# Patient Record
Sex: Male | Born: 1960 | Race: White | Hispanic: No | Marital: Married | Smoking: Never smoker
Health system: Southern US, Community
[De-identification: ages and names within clinical notes are randomized; demographics above are authoritative.]

## PROBLEM LIST (undated history)

## (undated) DIAGNOSIS — N289 Disorder of kidney and ureter, unspecified: Secondary | ICD-10-CM

## (undated) DIAGNOSIS — I2510 Atherosclerotic heart disease of native coronary artery without angina pectoris: Secondary | ICD-10-CM

## (undated) DIAGNOSIS — I519 Heart disease, unspecified: Secondary | ICD-10-CM

## (undated) HISTORY — PX: COLONOSCOPY: SHX174

## (undated) HISTORY — PX: SKIN CANCER EXCISION: SHX779

---

## 1991-12-28 HISTORY — PX: VASECTOMY: SHX75

## 2003-09-06 ENCOUNTER — Encounter: Payer: Self-pay | Admitting: Family Medicine

## 2003-09-06 ENCOUNTER — Encounter: Admission: RE | Admit: 2003-09-06 | Discharge: 2003-09-06 | Payer: Self-pay | Admitting: Family Medicine

## 2003-09-12 ENCOUNTER — Encounter: Payer: Self-pay | Admitting: Family Medicine

## 2003-09-12 ENCOUNTER — Encounter: Admission: RE | Admit: 2003-09-12 | Discharge: 2003-09-12 | Payer: Self-pay | Admitting: Family Medicine

## 2004-12-27 HISTORY — PX: KNEE SURGERY: SHX244

## 2006-05-03 ENCOUNTER — Ambulatory Visit: Payer: Self-pay | Admitting: Gastroenterology

## 2006-05-06 ENCOUNTER — Ambulatory Visit: Payer: Self-pay | Admitting: Gastroenterology

## 2009-01-30 ENCOUNTER — Encounter: Admission: RE | Admit: 2009-01-30 | Discharge: 2009-01-30 | Payer: Self-pay | Admitting: Internal Medicine

## 2011-05-26 ENCOUNTER — Other Ambulatory Visit: Payer: Self-pay | Admitting: Internal Medicine

## 2011-05-26 ENCOUNTER — Ambulatory Visit
Admission: RE | Admit: 2011-05-26 | Discharge: 2011-05-26 | Disposition: A | Discharge status: Home / Self Care | Payer: BC Managed Care – PPO | Source: Ambulatory Visit | Attending: Internal Medicine | Admitting: Internal Medicine

## 2011-05-26 DIAGNOSIS — R0602 Shortness of breath: Secondary | ICD-10-CM

## 2011-06-01 ENCOUNTER — Encounter: Payer: Self-pay | Admitting: Gastroenterology

## 2011-06-04 ENCOUNTER — Encounter: Payer: Self-pay | Admitting: Internal Medicine

## 2011-06-07 ENCOUNTER — Encounter: Payer: Self-pay | Admitting: Internal Medicine

## 2011-06-07 ENCOUNTER — Ambulatory Visit (INDEPENDENT_AMBULATORY_CARE_PROVIDER_SITE_OTHER): Payer: BC Managed Care – PPO | Admitting: Internal Medicine

## 2011-06-07 VITALS — BP 100/70 | HR 65 | Temp 98.1°F | Ht 70.0 in | Wt 162.4 lb

## 2011-06-07 DIAGNOSIS — R062 Wheezing: Secondary | ICD-10-CM

## 2011-06-07 DIAGNOSIS — J45909 Unspecified asthma, uncomplicated: Secondary | ICD-10-CM | POA: Insufficient documentation

## 2011-06-07 NOTE — Patient Instructions (Signed)
Please have breathing test called PFT Call our office [phone] as soon as it is done, so I can review and take the next step in management

## 2011-06-07 NOTE — Progress Notes (Signed)
Subjective:    Patient ID: Marc Sutton, male    DOB: 09-14-1961, 50 y.o.   MRN: [id number]  HPI 50 year old male. Referred by Dr. Tomi Bamberger of [place], [place]. 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    Says approximately 3-4 weeks ago on ? 05/24/2011 (he cannot remember exact date) was spray painting a hood of car (normally wears a full body suit with fresh air spray mask hood). Was wearing the usual protective gear but on that day there was a slit in bottom of hood that he was not aware of. Says normally pressure from spray gun normally bounces of the paint fumes. Was exposed to total 40 minutes pain exposure on and off over 2 hours (6 coats). Immediately after/Towards end finishing job, felt shortness of breath, chest tightness, chest pain through sternum, eyes bothered/burning, dizzy, tremors and overall feeling of sickness. Rated severity as 9 of 10. Did not go away for several/few days. So few days after exposure on 05/26/2011 went to Dr. Toni Arthurs who reportedly did a isocyanate test which reportedly came back okay, cxr reportedly normal (personally confirmed and agree), EKG normal (personally reviewed). Not given any nebs or breathing Rx at Dr. Toni Arthurs office. Despite symptoms has continued to paint starting 05/26/2011 the days of his doctor's visit. Has continued to pain but with protective gear.   Overall feels he is getting better and almost back to baseline; which is occassional chest tightness with sanding or heat or working with respirator. Also, mild dyspnea with above [place]  He states that above kind of symptoms have happened previously in an episodic fashion but much milder - scale 3-4 of 10.  Usually episodes last only the same day and go away when he returns home and sleeps. These episodes first started many years ago (possibly 15-20 years ago). Reckons he would get mild acute episodes 1-2 times a  week or perhaps 1 per 2 week. He insists that he has always been compliant with protective gear. Fresh air relieved symptoms always. Symptoms always chest tightness, some wheezing perhaps but never dizziness or tremors.   Reports cockatail at home for several years. No smoke exposure. 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  HENT: Negative for ear pain, nosebleeds, congestion, sore throat, rhinorrhea, sneezing, trouble swallowing, dental problem, postnasal drip and sinus pressure.   Eyes: Negative for redness and itching.  Respiratory: Positive for chest tightness and shortness of breath. Negative for cough and wheezing.   Cardiovascular: Negative for palpitations and leg swelling.  Gastrointestinal: Negative for nausea and vomiting.  Genitourinary: Negative for dysuria.  Musculoskeletal: Negative for joint swelling.  Skin: Negative for rash.  Neurological: Negative for headaches.  Hematological: Does not bruise/bleed easily.  Psychiatric/Behavioral: Negative for dysphoric mood. The patient is not nervous/anxious.        Objective:   Physical Exam  Nursing note and vitals reviewed. Constitutional: He is oriented to person, place, and time. He appears well-developed and well-nourished. No distress.  HENT:  Head: Normocephalic and atraumatic.  Right Ear: External ear normal.  Left Ear: External ear normal.  Mouth/Throat: Oropharynx is clear and moist. No oropharyngeal exudate.  Eyes: Conjunctivae and EOM are normal. Pupils are equal, round, and reactive to light.  Right eye exhibits no discharge. Left eye exhibits no discharge. No scleral icterus.  Neck: Normal range of motion. Neck supple. No JVD present. No tracheal deviation present. No thyromegaly present.  Cardiovascular: Normal rate, regular rhythm and intact distal pulses.  Exam  reveals no gallop and no friction rub.   No murmur heard. Pulmonary/Chest: Effort normal and breath sounds normal. No respiratory distress. He has no wheezes. He has no rales. He exhibits no tenderness.  Abdominal: Soft. Bowel sounds are normal. He exhibits no distension and no mass. There is no tenderness. There is no rebound and no guarding.  Musculoskeletal: Normal range of motion. He exhibits no edema and no tenderness.  Lymphadenopathy:    He has no cervical adenopathy.  Neurological: He is alert and oriented to person, place, and time. He has normal reflexes. No cranial nerve deficit. Coordination normal.  Skin: Skin is warm and dry. No rash noted. He is not diaphoretic. No erythema. No pallor.  Psychiatric: He has a normal mood and affect. His behavior is normal. Judgment and thought content normal.          Assessment & Plan:

## 2011-06-07 NOTE — Assessment & Plan Note (Signed)
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 ?  RADS criteria (has preceding symptoms but not preceding diagnosis)  Plan Full pft After that, he calls me to review results over phone If PFT normal, do methacholine challenge test

## 2011-06-17 ENCOUNTER — Ambulatory Visit (INDEPENDENT_AMBULATORY_CARE_PROVIDER_SITE_OTHER): Payer: BC Managed Care – PPO | Admitting: Internal Medicine

## 2011-06-17 DIAGNOSIS — R062 Wheezing: Secondary | ICD-10-CM

## 2011-06-17 LAB — PULMONARY FUNCTION TEST

## 2011-06-17 NOTE — Progress Notes (Signed)
PFT done today. 

## 2011-06-23 ENCOUNTER — Telehealth: Payer: Self-pay | Admitting: Internal Medicine

## 2011-06-23 DIAGNOSIS — R0600 Dyspnea, unspecified: Secondary | ICD-10-CM

## 2011-06-23 NOTE — Telephone Encounter (Signed)
Pt had PFT 06/17/11. Requesting these results, pls advise thanks

## 2011-06-23 NOTE — Telephone Encounter (Signed)
Results in Jennifer/MR basket-not read at this time-pt aware and also knows that MR is out of town-will have CY read and advise on Thursday morning; I will call patient with results Thursday morning.

## 2011-06-23 NOTE — Telephone Encounter (Signed)
It was not on my desk when I last looked at it before the weekend and cleared everything on Sunday. I am out of town now. ARe you in position to scan into epic wihtout me signing it ?IF so, I can take a look at it today/tomorrow. I am back Friday later evening and can take a look that night when I get back. I prefer you scan into epic but if procedurally you cannot just leave it on my desk for me to look at. Just let me know

## 2011-06-24 NOTE — Telephone Encounter (Signed)
Because PFTs normal (as read by Dr Maple Hudson), he Needs methacholine challenge test (as discussed with him at last visit). I have ordered it. Please get it and after that he can wait and see me in agugust or see Tammy P for followup

## 2011-06-24 NOTE — Telephone Encounter (Signed)
Per CY-test is normal.   Had to leave a message for patient to call back.

## 2011-06-24 NOTE — Telephone Encounter (Signed)
Spoke with pt and he states already scheduled for MCT for 07/01/11. He states would like to call back to sched appt for followup on this. Nothing further needed.

## 2011-06-28 ENCOUNTER — Encounter (HOSPITAL_COMMUNITY): Payer: BC Managed Care – PPO

## 2011-06-29 ENCOUNTER — Encounter: Payer: Self-pay | Admitting: Internal Medicine

## 2011-07-01 ENCOUNTER — Encounter: Payer: Self-pay | Admitting: Gastroenterology

## 2011-07-01 ENCOUNTER — Ambulatory Visit (HOSPITAL_COMMUNITY)
Admission: RE | Admit: 2011-07-01 | Discharge: 2011-07-01 | Disposition: A | Discharge status: Home / Self Care | Payer: BC Managed Care – PPO | Source: Ambulatory Visit | Attending: Internal Medicine | Admitting: Internal Medicine

## 2011-07-01 DIAGNOSIS — R0600 Dyspnea, unspecified: Secondary | ICD-10-CM

## 2011-07-01 DIAGNOSIS — R0989 Other specified symptoms and signs involving the circulatory and respiratory systems: Secondary | ICD-10-CM | POA: Insufficient documentation

## 2011-07-01 DIAGNOSIS — R0609 Other forms of dyspnea: Secondary | ICD-10-CM | POA: Insufficient documentation

## 2011-07-01 LAB — PULMONARY FUNCTION TEST

## 2011-07-26 ENCOUNTER — Ambulatory Visit (AMBULATORY_SURGERY_CENTER): Payer: BC Managed Care – PPO | Admitting: *Deleted

## 2011-07-26 VITALS — Ht 70.0 in | Wt 159.3 lb

## 2011-07-26 DIAGNOSIS — Z1211 Encounter for screening for malignant neoplasm of colon: Secondary | ICD-10-CM

## 2011-07-26 DIAGNOSIS — Z800 Family history of malignant neoplasm of digestive organs: Secondary | ICD-10-CM

## 2011-07-26 MED ORDER — PEG-KCL-NACL-NASULF-NA ASC-C 100 G PO SOLR: ORAL | Status: DC

## 2011-07-28 ENCOUNTER — Encounter: Payer: Self-pay | Admitting: Gastroenterology

## 2011-08-09 ENCOUNTER — Ambulatory Visit (AMBULATORY_SURGERY_CENTER): Payer: BC Managed Care – PPO | Admitting: Gastroenterology

## 2011-08-09 DIAGNOSIS — Z1211 Encounter for screening for malignant neoplasm of colon: Secondary | ICD-10-CM

## 2011-08-09 DIAGNOSIS — Z800 Family history of malignant neoplasm of digestive organs: Secondary | ICD-10-CM

## 2011-08-09 MED ORDER — SODIUM CHLORIDE 0.9 % IV SOLN: 500.0000 mL | INTRAVENOUS | Status: DC

## 2011-08-09 NOTE — Patient Instructions (Signed)
Your test was normal.    You may resume your routine medications today.   We will see you again in 5 years for another colonoscopy.   If you have any questions or concerns, call us at [phone].   Thank-you.

## 2011-08-10 ENCOUNTER — Telehealth: Payer: Self-pay

## 2011-08-10 NOTE — Telephone Encounter (Signed)

## 2014-02-18 ENCOUNTER — Ambulatory Visit (INDEPENDENT_AMBULATORY_CARE_PROVIDER_SITE_OTHER): Payer: BC Managed Care – PPO | Admitting: Family Medicine

## 2014-02-18 VITALS — BP 118/62 | HR 98 | Temp 98.2°F | Resp 16 | Ht 69.75 in | Wt 159.0 lb

## 2014-02-18 DIAGNOSIS — K529 Noninfective gastroenteritis and colitis, unspecified: Secondary | ICD-10-CM

## 2014-02-18 DIAGNOSIS — K5289 Other specified noninfective gastroenteritis and colitis: Secondary | ICD-10-CM

## 2014-02-18 DIAGNOSIS — R197 Diarrhea, unspecified: Secondary | ICD-10-CM

## 2014-02-18 MED ORDER — CIPROFLOXACIN HCL 500 MG PO TABS: 500.0000 mg | ORAL_TABLET | Freq: Two times a day (BID) | ORAL | Status: DC

## 2014-02-18 NOTE — Patient Instructions (Addendum)
Take the Cipro twice daily for the next 7 days.    Take Immodium to help with the symptoms.  Two at once, then 1 after that with diarrhea as per the instructions.    It was good to meet you  If you start having any worsening diarrhea, vomting, unable to hold down fluids, come back immediately or go to the ED.

## 2014-02-18 NOTE — Progress Notes (Signed)
Marc Sutton is a 53 y.o. male who presents to Urgent Care today with complaints of diarrhea:  1.  Diarrhea:  Patient started with nausea and abdominal cramping Firday evening after eating lunch at [employer] on Friday.  Slept fitfully that night.  Began having diarrhea Saurday PM/evening.  Started in earnest Sunday (yesterday).  Has slacked off somewhat today but still present.  No vomiting, still some mild nausea.  Eating and drinking well.   No tenesmus.  Diarrhea is watery.  No melena, no hematochezia.    No fevers or chills.  Has tried Pepto and nothing else for relief.      PMH reviewed.  History reviewed. No pertinent past medical history. Past Surgical History  Procedure Laterality Date  . Knee surgery  2006    left knee arthroscopy  . Vasectomy  1993    Medications reviewed. Current Outpatient Prescriptions  Medication Sig Dispense Refill  . ibuprofen (ADVIL,MOTRIN) 600 MG tablet Take 600 mg by mouth every 6 (six) hours as needed.         No current facility-administered medications for this visit.    ROS as above otherwise neg.   Physical Exam:  BP 118/62  Pulse 98  Temp(Src) 98.2 F (36.8 C) (Oral)  Resp 16  Ht 5' 9.75" (1.772 m)  Wt 159 lb (72.122 kg)  BMI 22.97 kg/m2  SpO2 97% Gen:  Alert, cooperative patient who appears stated age in no acute distress.  Vital signs reviewed. HEENT: EOMI,  MMM.  Mucosa pink and moist Pulm:  Clear to auscultation bilaterally  Cardiac:  Regular rate and rhythm . Abd:  No focal tenderness.  Some generalized mild discomfort on palpation.  Soft/nondistended.     Assessment and Plan:  1.  Gastroenteritis: - Treat with Cipro - Patient appears well hydrated. - Immdium for symptomatic relief. - FU if worsening or no improvement.

## 2014-10-01 DIAGNOSIS — M79673 Pain in unspecified foot: Secondary | ICD-10-CM | POA: Insufficient documentation

## 2014-10-01 DIAGNOSIS — M25561 Pain in right knee: Secondary | ICD-10-CM | POA: Insufficient documentation

## 2016-07-14 ENCOUNTER — Encounter: Payer: Self-pay | Admitting: Internal Medicine

## 2016-08-16 ENCOUNTER — Encounter: Payer: Self-pay | Admitting: Internal Medicine

## 2016-09-27 ENCOUNTER — Telehealth: Payer: Self-pay

## 2016-09-27 ENCOUNTER — Ambulatory Visit (AMBULATORY_SURGERY_CENTER): Payer: Self-pay

## 2016-09-27 VITALS — Ht 70.0 in | Wt 169.6 lb

## 2016-09-27 DIAGNOSIS — Z8371 Family history of colonic polyps: Secondary | ICD-10-CM

## 2016-09-27 DIAGNOSIS — Z83719 Family history of colon polyps, unspecified: Secondary | ICD-10-CM

## 2016-09-27 MED ORDER — SUPREP BOWEL PREP KIT 17.5-3.13-1.6 GM/177ML PO SOLN: 1.0000 | Freq: Once | ORAL | 0 refills | Status: AC

## 2016-09-27 NOTE — Telephone Encounter (Signed)
Dr Hilarie Fredrickson,        Pt had colon in 2007 and 2012 (both normal) based on GI:[id number] CA-dad which we just learned this PV was NOT colon CA.  Do you want pt to proceed with this 5 yr F/U or does he need to wait until 2022?  Please advise.                                                                Thanks,                                                                   Brunella Wileman/PV

## 2016-09-27 NOTE — Telephone Encounter (Signed)
5 yrs given family history of colon cancer

## 2016-09-27 NOTE — Telephone Encounter (Signed)
That's what I'm saying, there is NO colon CA history.  The pt was mistaken about his dad.  He has had prostate CA but definitely NO COLON CA.

## 2016-09-27 NOTE — Progress Notes (Signed)
No allergies to eggs or soy No past problems with anesthesia No diet meds No home oxygen  Declined emmi 

## 2016-09-27 NOTE — Telephone Encounter (Signed)
Corwin, sorry for misunderstanding Can change recall to 10 yrs for screening (now known to be average risk) Thanks JMP

## 2016-10-18 ENCOUNTER — Encounter: Payer: Self-pay | Admitting: Internal Medicine

## 2016-12-09 DIAGNOSIS — N521 Erectile dysfunction due to diseases classified elsewhere: Secondary | ICD-10-CM | POA: Insufficient documentation

## 2016-12-09 DIAGNOSIS — Z8042 Family history of malignant neoplasm of prostate: Secondary | ICD-10-CM | POA: Insufficient documentation

## 2016-12-09 DIAGNOSIS — Z87442 Personal history of urinary calculi: Secondary | ICD-10-CM | POA: Insufficient documentation

## 2016-12-09 DIAGNOSIS — N486 Induration penis plastica: Secondary | ICD-10-CM | POA: Insufficient documentation

## 2019-04-17 ENCOUNTER — Ambulatory Visit
Admission: RE | Admit: 2019-04-17 | Discharge: 2019-04-17 | Disposition: A | Discharge status: Home / Self Care | Payer: BLUE CROSS/BLUE SHIELD | Source: Ambulatory Visit | Attending: Family Medicine | Admitting: Family Medicine

## 2019-04-17 ENCOUNTER — Other Ambulatory Visit: Payer: Self-pay | Admitting: Family Medicine

## 2019-04-17 ENCOUNTER — Other Ambulatory Visit: Payer: Self-pay

## 2019-04-17 DIAGNOSIS — M545 Low back pain, unspecified: Secondary | ICD-10-CM

## 2019-05-31 DIAGNOSIS — N401 Enlarged prostate with lower urinary tract symptoms: Secondary | ICD-10-CM | POA: Insufficient documentation

## 2019-06-01 ENCOUNTER — Other Ambulatory Visit: Payer: Self-pay | Admitting: Urology

## 2019-06-01 ENCOUNTER — Ambulatory Visit
Admission: RE | Admit: 2019-06-01 | Discharge: 2019-06-01 | Disposition: A | Discharge status: Home / Self Care | Payer: BC Managed Care – PPO | Source: Ambulatory Visit | Attending: Urology | Admitting: Urology

## 2019-06-01 ENCOUNTER — Other Ambulatory Visit: Payer: Self-pay

## 2019-06-01 DIAGNOSIS — R1900 Intra-abdominal and pelvic swelling, mass and lump, unspecified site: Secondary | ICD-10-CM

## 2019-06-01 MED ORDER — IOPAMIDOL (ISOVUE-300) INJECTION 61%
100.0000 mL | Freq: Once | INTRAVENOUS | Status: AC | PRN
  Administered 2019-06-01: 100 mL via INTRAVENOUS

## 2020-06-05 ENCOUNTER — Ambulatory Visit
Admission: RE | Admit: 2020-06-05 | Discharge: 2020-06-05 | Disposition: A | Discharge status: Home / Self Care | Payer: BC Managed Care – PPO | Source: Ambulatory Visit | Attending: Obstetrics and Gynecology | Admitting: Obstetrics and Gynecology

## 2020-06-05 ENCOUNTER — Other Ambulatory Visit: Payer: Self-pay | Admitting: Obstetrics and Gynecology

## 2020-06-05 DIAGNOSIS — G4700 Insomnia, unspecified: Secondary | ICD-10-CM

## 2020-06-05 DIAGNOSIS — N183 Chronic kidney disease, stage 3 unspecified: Secondary | ICD-10-CM

## 2020-06-05 DIAGNOSIS — S43402A Unspecified sprain of left shoulder joint, initial encounter: Secondary | ICD-10-CM

## 2020-06-05 DIAGNOSIS — E785 Hyperlipidemia, unspecified: Secondary | ICD-10-CM

## 2020-06-05 DIAGNOSIS — Z806 Family history of leukemia: Secondary | ICD-10-CM

## 2020-06-05 DIAGNOSIS — F419 Anxiety disorder, unspecified: Secondary | ICD-10-CM

## 2020-06-10 ENCOUNTER — Other Ambulatory Visit: Payer: Self-pay | Admitting: Obstetrics and Gynecology

## 2020-06-10 DIAGNOSIS — M25512 Pain in left shoulder: Secondary | ICD-10-CM

## 2020-06-10 DIAGNOSIS — M2480 Other specific joint derangements of unspecified joint, not elsewhere classified: Secondary | ICD-10-CM

## 2020-07-19 ENCOUNTER — Ambulatory Visit
Admission: RE | Admit: 2020-07-19 | Discharge: 2020-07-19 | Disposition: A | Discharge status: Home / Self Care | Payer: BC Managed Care – PPO | Source: Ambulatory Visit | Attending: Obstetrics and Gynecology | Admitting: Obstetrics and Gynecology

## 2020-07-19 ENCOUNTER — Other Ambulatory Visit: Payer: Self-pay

## 2020-07-19 DIAGNOSIS — M2480 Other specific joint derangements of unspecified joint, not elsewhere classified: Secondary | ICD-10-CM

## 2020-07-19 DIAGNOSIS — M25512 Pain in left shoulder: Secondary | ICD-10-CM

## 2020-07-19 MED ORDER — GADOBUTROL 1 MMOL/ML IV SOLN
10.0000 mL | Freq: Once | INTRAVENOUS | Status: AC | PRN
  Administered 2020-07-19: 1 mL via INTRAVENOUS

## 2020-08-19 DIAGNOSIS — M25512 Pain in left shoulder: Secondary | ICD-10-CM | POA: Insufficient documentation

## 2020-09-10 ENCOUNTER — Telehealth: Payer: Self-pay | Admitting: Unknown Physician Specialty

## 2020-09-10 NOTE — Telephone Encounter (Signed)
Called to Discuss with patient about Covid symptoms and the use of the monoclonal antibody infusion for those with mild to moderate Covid symptoms and at a high risk of hospitalization.     Pt appears to qualify for this infusion due to co-morbid conditions and/or a member of an at-risk group in accordance with the FDA Emergency Use Authorization.    Unable to reach pt    

## 2020-09-11 ENCOUNTER — Telehealth (HOSPITAL_COMMUNITY): Payer: Self-pay | Admitting: Nurse Practitioner

## 2020-09-11 DIAGNOSIS — U071 COVID-19: Secondary | ICD-10-CM

## 2020-09-11 NOTE — Telephone Encounter (Signed)
Called to discuss with Reatha Harps about Covid symptoms and the use of regeneron, a monoclonal antibody infusion for those with mild to moderate Covid symptoms and at a high risk of hospitalization.     Pt is not qualified for this infusion due to lack of identified risk factors and co-morbid conditions.  Symptoms reviewed as well as criteria for ending isolation.  Symptoms reviewed that would warrant ED/Hospital evaluation as well should condition worsen. Preventative practices reviewed. Patient verbalized understanding. He denies history of asthma and is not currently on medication for such. Symptoms are mild and improving. He has had first dose of covid 19 vaccine on 09/05/20. Discussed timing of second vaccine.     Patient Active Problem List   Diagnosis Date Noted  . 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

## 2020-09-30 ENCOUNTER — Other Ambulatory Visit: Payer: Self-pay | Admitting: Obstetrics and Gynecology

## 2020-09-30 ENCOUNTER — Ambulatory Visit
Admission: RE | Admit: 2020-09-30 | Discharge: 2020-09-30 | Disposition: A | Discharge status: Home / Self Care | Payer: BC Managed Care – PPO | Source: Ambulatory Visit | Attending: Obstetrics and Gynecology | Admitting: Obstetrics and Gynecology

## 2020-09-30 DIAGNOSIS — J069 Acute upper respiratory infection, unspecified: Secondary | ICD-10-CM

## 2020-09-30 DIAGNOSIS — R0602 Shortness of breath: Secondary | ICD-10-CM

## 2020-09-30 DIAGNOSIS — F419 Anxiety disorder, unspecified: Secondary | ICD-10-CM

## 2021-04-07 ENCOUNTER — Emergency Department (HOSPITAL_COMMUNITY)

## 2021-04-07 ENCOUNTER — Other Ambulatory Visit: Payer: Self-pay

## 2021-04-07 ENCOUNTER — Encounter (HOSPITAL_COMMUNITY): Payer: Self-pay

## 2021-04-07 ENCOUNTER — Emergency Department (HOSPITAL_COMMUNITY)
Admission: EM | Admit: 2021-04-07 | Discharge: 2021-04-07 | Disposition: A | Discharge status: Home / Self Care | Attending: Emergency Medicine | Admitting: Emergency Medicine

## 2021-04-07 DIAGNOSIS — S3992XA Unspecified injury of lower back, initial encounter: Secondary | ICD-10-CM | POA: Diagnosis present

## 2021-04-07 DIAGNOSIS — Y990 Civilian activity done for income or pay: Secondary | ICD-10-CM | POA: Insufficient documentation

## 2021-04-07 DIAGNOSIS — X500XXA Overexertion from strenuous movement or load, initial encounter: Secondary | ICD-10-CM | POA: Insufficient documentation

## 2021-04-07 DIAGNOSIS — S39012A Strain of muscle, fascia and tendon of lower back, initial encounter: Secondary | ICD-10-CM | POA: Diagnosis not present

## 2021-04-07 DIAGNOSIS — J45909 Unspecified asthma, uncomplicated: Secondary | ICD-10-CM | POA: Diagnosis not present

## 2021-04-07 DIAGNOSIS — M545 Low back pain, unspecified: Secondary | ICD-10-CM

## 2021-04-07 HISTORY — DX: Disorder of kidney and ureter, unspecified: N28.9

## 2021-04-07 MED ORDER — DICLOFENAC SODIUM 75 MG PO TBEC: 75.0000 mg | DELAYED_RELEASE_TABLET | Freq: Two times a day (BID) | ORAL | 0 refills | Status: DC

## 2021-04-07 MED ORDER — METHOCARBAMOL 500 MG PO TABS: 500.0000 mg | ORAL_TABLET | Freq: Four times a day (QID) | ORAL | 0 refills | Status: DC

## 2021-04-07 NOTE — ED Triage Notes (Signed)
Patient c/o mid lower back pain since yesterday. Patient states he was trying to lift a heavy object at work yesterday. Patient states the pain is worse when he walks and the pain radiates into his legs when ambulating.

## 2021-04-07 NOTE — Discharge Instructions (Signed)
Return if any problems. See your Physician for recheck if pain persist  

## 2021-04-07 NOTE — ED Provider Notes (Signed)
[facility] DEPT Provider Note   CSN: [id number] Arrival date & time: 04/07/21  7253     History Chief Complaint  Patient presents with  . Back Pain    Marc Sutton is a 60 y.o. male.  The history is provided by the patient. No language interpreter was used.  Back Pain Location:  Lumbar spine Quality:  Aching Radiates to:  Does not radiate Pain severity:  Moderate Pain is:  Same all the time Onset quality:  Sudden Timing:  Constant Progression:  Unchanged Chronicity:  New Context: recent injury   Relieved by:  Nothing Worsened by:  Nothing Ineffective treatments:  None tried Risk factors: no steroid use   pt was lifting with a coworker and began having back pain.      Past Medical History:  Diagnosis Date  . Kidney disease     Patient Active Problem List   Diagnosis Date Noted  . 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 COLONOSCOPY    . KNEE SURGERY  2006   left knee arthroscopy  . VASECTOMY  1993       Family History  Problem Relation Age of Onset  . Heart disease Father   . Hypertension Mother   . Clotting disorder Mother        "body does nt omake blood" has to get transfusions    Social History   Tobacco Use  . Smoking status: Never Smoker  . Smokeless tobacco: Never Used  Vaping Use  . Vaping Use: Never used  Substance Use Topics  . Alcohol use: Yes    Alcohol/week: 3.0 standard drinks    Types: 3 Standard drinks or equivalent per week    Comment: occasionally  . Drug use: No    Home Medications Prior to Admission medications   Medication Sig Start Date End Date Taking? Authorizing Provider  ibuprofen (ADVIL,MOTRIN) 600 MG tablet Take 600 mg by mouth every 6 (six) hours as needed.      [provider]    Allergies    Codeine  Review of Systems   Review of Systems  Musculoskeletal: Positive for back pain.  All other systems reviewed and  are negative.   Physical Exam Updated Vital Signs BP (!) 146/99 (BP Location: Right Arm)   Pulse 77   Temp 98.5 F (36.9 C) (Oral)   Resp 16   Ht 5\' 10"  (1.778 m)   Wt 72.6 kg   SpO2 98%   BMI 22.96 kg/m   Physical Exam Vitals and nursing note reviewed.  Constitutional:      Appearance: He is well-developed.  HENT:     Head: Normocephalic and atraumatic.  Eyes:     Conjunctiva/sclera: Conjunctivae normal.  Cardiovascular:     Rate and Rhythm: Normal rate.  Pulmonary:     Effort: Pulmonary effort is normal. No respiratory distress.  Musculoskeletal:        General: Normal range of motion.     Cervical back: Neck supple.     Comments: Tender ls spine, good reflexes   Skin:    General: Skin is warm and dry.  Neurological:     General: No focal deficit present.     Mental Status: He is alert.     ED Results / Procedures / Treatments   Labs (all labs ordered are listed, but only abnormal results are displayed) Labs Reviewed - No data to display  EKG None  Radiology DG Lumbar Spine Complete  Result Date: 04/07/2021 CLINICAL DATA:  60 year old male with low back pain after heavy lifting yesterday. EXAM: LUMBAR SPINE - COMPLETE 4+ VIEW COMPARISON:  Lumbar radiographs 04/17/2019. FINDINGS: Transitional anatomy. Hypoplastic or absent ribs at T12 when designating normal lumbar segmentation. Full size ribs at T11. Stable lumbar lordosis, vertebral height and alignment. No pars fracture. Visible lower thoracic levels appear stable and intact. Visible sacrum and SI joints appear stable and intact. Mild to moderate L5-S1 disc space loss has increased. Other lumbar disc spaces appear stable and relatively preserved. Calcified aortic atherosclerosis. Negative other abdominal visceral contours. IMPRESSION: 1. Transitional anatomy, hypoplastic or absent ribs designated at T12 with normal lumbar segmentation for this exam. 2. No acute osseous abnormality identified in the lumbar spine.  3. Evidence of increased L5-S1 disc degeneration since 2020. Electronically Signed   By: Genevie Ann M.D.   On: 04/07/2021 11:53    Procedures Procedures   Medications Ordered in ED Medications - No data to display  ED Course  I have reviewed the triage vital signs and the nursing notes.  Pertinent labs & imaging results that were available during my care of the patient were reviewed by me and considered in my medical decision making (see chart for details).    MDM Rules/Calculators/A&P                          MDM:  Xray shows degenerative changes.  Pt given rx for voltaren and robaxin  Final Clinical Impression(s) / ED Diagnoses Final diagnoses:  Acute low back pain without sciatica, unspecified back pain laterality  Strain of lumbar region, initial encounter    Rx / DC Orders ED Discharge Orders         Ordered    diclofenac (VOLTAREN) 75 MG EC tablet  2 times daily        04/07/21 1209    methocarbamol (ROBAXIN) 500 MG tablet  4 times daily        04/07/21 1209        An After Visit Summary was printed and given to the patient.    Fransico Meadow, PA-C 04/07/21 1210    Charlesetta Shanks, MD 04/08/21 1253

## 2021-06-12 ENCOUNTER — Other Ambulatory Visit: Payer: Self-pay | Admitting: Orthopedic Surgery

## 2021-06-12 DIAGNOSIS — M542 Cervicalgia: Secondary | ICD-10-CM

## 2021-08-14 DIAGNOSIS — N183 Chronic kidney disease, stage 3 unspecified: Secondary | ICD-10-CM | POA: Insufficient documentation

## 2021-08-16 ENCOUNTER — Other Ambulatory Visit: Payer: Self-pay

## 2021-08-16 ENCOUNTER — Ambulatory Visit
Admission: RE | Admit: 2021-08-16 | Discharge: 2021-08-16 | Disposition: A | Discharge status: Home / Self Care | Payer: Self-pay | Source: Ambulatory Visit | Attending: Orthopedic Surgery | Admitting: Orthopedic Surgery

## 2021-08-16 DIAGNOSIS — M542 Cervicalgia: Secondary | ICD-10-CM

## 2021-08-17 ENCOUNTER — Other Ambulatory Visit: Payer: Self-pay | Admitting: Nephrology

## 2021-08-17 DIAGNOSIS — R944 Abnormal results of kidney function studies: Secondary | ICD-10-CM

## 2021-08-27 ENCOUNTER — Ambulatory Visit: Admission: RE | Admit: 2021-08-27 | Payer: Self-pay | Source: Ambulatory Visit

## 2021-09-15 ENCOUNTER — Encounter: Payer: Self-pay | Admitting: *Deleted

## 2021-09-17 ENCOUNTER — Inpatient Hospital Stay: Payer: Self-pay

## 2021-09-17 ENCOUNTER — Encounter: Payer: Self-pay | Admitting: Internal Medicine

## 2021-09-17 ENCOUNTER — Inpatient Hospital Stay: Payer: Self-pay | Attending: Internal Medicine | Admitting: Internal Medicine

## 2021-09-17 ENCOUNTER — Encounter (INDEPENDENT_AMBULATORY_CARE_PROVIDER_SITE_OTHER): Payer: Self-pay

## 2021-09-17 DIAGNOSIS — R5383 Other fatigue: Secondary | ICD-10-CM

## 2021-09-17 DIAGNOSIS — D472 Monoclonal gammopathy: Secondary | ICD-10-CM

## 2021-09-17 DIAGNOSIS — D631 Anemia in chronic kidney disease: Secondary | ICD-10-CM

## 2021-09-17 DIAGNOSIS — N183 Chronic kidney disease, stage 3 unspecified: Secondary | ICD-10-CM

## 2021-09-17 LAB — CBC WITH DIFFERENTIAL/PLATELET
Abs Immature Granulocytes: 0.03 10*3/uL (ref 0.00–0.07)
Basophils Absolute: 0.1 10*3/uL (ref 0.0–0.1)
Basophils Relative: 1 %
Eosinophils Absolute: 0.3 10*3/uL (ref 0.0–0.5)
Eosinophils Relative: 5 %
HCT: 42.1 % (ref 39.0–52.0)
Hemoglobin: 14.2 g/dL (ref 13.0–17.0)
Immature Granulocytes: 1 %
Lymphocytes Relative: 20 %
Lymphs Abs: 1.3 10*3/uL (ref 0.7–4.0)
MCH: 29.8 pg (ref 26.0–34.0)
MCHC: 33.7 g/dL (ref 30.0–36.0)
MCV: 88.4 fL (ref 80.0–100.0)
Monocytes Absolute: 0.6 10*3/uL (ref 0.1–1.0)
Monocytes Relative: 9 %
Neutro Abs: 4.3 10*3/uL (ref 1.7–7.7)
Neutrophils Relative %: 64 %
Platelets: 274 10*3/uL (ref 150–400)
RBC: 4.76 MIL/uL (ref 4.22–5.81)
RDW: 12.3 % (ref 11.5–15.5)
WBC: 6.6 10*3/uL (ref 4.0–10.5)
nRBC: 0 % (ref 0.0–0.2)

## 2021-09-17 LAB — IRON AND TIBC
Iron: 75 ug/dL (ref 45–182)
Saturation Ratios: 23 % (ref 17.9–39.5)
TIBC: 325 ug/dL (ref 250–450)
UIBC: 250 ug/dL

## 2021-09-17 LAB — LACTATE DEHYDROGENASE: LDH: 138 U/L (ref 98–192)

## 2021-09-17 LAB — FERRITIN: Ferritin: 133 ng/mL (ref 24–336)

## 2021-09-17 NOTE — Progress Notes (Signed)
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   HISTORY OF PRESENTING ILLNESS:  Marc Sutton 60 y.o.  male has been referred to [place] by nephrology for further evaluation/work-up for monoclonal gammopathy.  Patient states that he had been diagnosed with chronic kidney disease on insulin blood work approximately 1 to 2 years ago.  However recently evaluated by nephrology-to have chronic kidney disease stage III.  Currently pending ultrasound.  Chronic back pain currently undergoing physical therapy.  Reports to have work-related injury.  Chronic mild -moderate fatigue.  Denies any tingling or numbness.  Patient is concerned given family history of blood cancers.  Family history: mother- blood cancer [need blood transfusion; sister- blood cancer-died in [place], in 03/11/2015.    Review of Systems  Constitutional:  Positive for malaise/fatigue. Negative for chills, diaphoresis, fever and weight loss.  HENT:  Negative for nosebleeds and sore throat.   Eyes:  Negative for double vision.  Respiratory:  Negative for cough, hemoptysis, sputum production, shortness of breath and wheezing.   Cardiovascular:  Negative for chest pain, palpitations, orthopnea and leg swelling.  Gastrointestinal:  Negative for abdominal pain, blood in stool, constipation, diarrhea, heartburn, melena, nausea and vomiting.  Genitourinary:  Negative for dysuria, frequency and urgency.  Musculoskeletal:  Positive for back pain and joint pain.  Skin: Negative.  Negative for itching and rash.  Neurological:  Negative for dizziness, tingling, focal weakness, weakness and  headaches.  Endo/Heme/Allergies:  Does not bruise/bleed easily.  Psychiatric/Behavioral:  Negative for depression. The patient is not nervous/anxious and does not have insomnia.    MEDICAL HISTORY:  Past Medical History:  Diagnosis Date   Kidney disease     SURGICAL HISTORY: Past Surgical History:  Procedure Laterality Date   COLONOSCOPY     KNEE SURGERY  10-Mar-2005   left knee arthroscopy   VASECTOMY  03-10-1992    SOCIAL HISTORY: Social History   Socioeconomic History   Marital status: Married    Spouse name: Not on file   Number of children: Not on file   Years of education: Not on file   Highest education level: Not on file  Occupational History   Occupation: Nurse, adult  Tobacco Use   Smoking status: Never   Smokeless tobacco: Never  Vaping Use   Vaping Use: Never used  Substance and Sexual Activity   Alcohol use: Yes    Alcohol/week: 3.0 standard drinks    Types: 3 Standard drinks or equivalent per week    Comment: occasionally   Drug use: No   Sexual activity: Yes    Birth control/protection: None  Other Topics Concern   Not on file  Social History Narrative   [place] in [place]; lies with wife; grown up son/daughter [lives close]; never smoked; no alcohol. Presently not working;  worked in [place] Strain: Not on [employer] Insecurity: Not on file  Transportation Needs: Not on file  Physical Activity: Not on file  Stress: Not on file  Social Connections: Not on file  Intimate Partner Violence: Not on file    FAMILY HISTORY: Family History  Problem  Relation Age of Onset   Hypertension Mother    Clotting disorder Mother        "body does not make blood" has to get transfusions   Heart disease Father     ALLERGIES:  is allergic to codeine.  MEDICATIONS:  Current Outpatient Medications  Medication Sig Dispense Refill   diclofenac (VOLTAREN) 75 MG EC tablet Take 1 tablet (75  mg total) by mouth 2 (two) times daily. (Patient not taking: Reported on 09/17/2021) 20 tablet 0   ibuprofen (ADVIL,MOTRIN) 600 MG tablet Take 600 mg by mouth every 6 (six) hours as needed.   (Patient not taking: Reported on 09/17/2021)     methocarbamol (ROBAXIN) 500 MG tablet Take 1 tablet (500 mg total) by mouth 4 (four) times daily. (Patient not taking: Reported on 09/17/2021) 20 tablet 0   No current facility-administered medications for this visit.      PHYSICAL EXAMINATION:   Vitals:   09/17/21 1100  BP: 128/90  Pulse: 76  Resp: 20  Temp: 97.8 F (36.6 C)  SpO2: 100%   Filed Weights   09/17/21 1100  Weight: 162 lb 4.8 oz (73.6 kg)    Physical Exam Vitals and nursing note reviewed.  HENT:     Head: Normocephalic and atraumatic.     Mouth/Throat:     Pharynx: Oropharynx is clear.  Eyes:     Extraocular Movements: Extraocular movements intact.     Pupils: Pupils are equal, round, and reactive to light.  Cardiovascular:     Rate and Rhythm: Normal rate and regular rhythm.  Pulmonary:     Comments: Decreased breath sounds bilaterally.  Abdominal:     Palpations: Abdomen is soft.  Musculoskeletal:        General: Normal range of motion.     Cervical back: Normal range of motion.  Skin:    General: Skin is warm.  Neurological:     General: No focal deficit present.     Mental Status: He is alert and oriented to person, place, and time.  Psychiatric:        Behavior: Behavior normal.        Judgment: Judgment normal.   LABORATORY DATA:  I have reviewed the data as listed Lab Results  Component Value Date   WBC 6.6 09/17/2021   HGB 14.2 09/17/2021   HCT 42.1 09/17/2021   MCV 88.4 09/17/2021   PLT 274 09/17/2021   No results for input(s): NA, K, CL, CO2, GLUCOSE, BUN, CREATININE, CALCIUM, GFRNONAA, GFRAA, PROT, ALBUMIN, AST, ALT, ALKPHOS, BILITOT, BILIDIR, IBILI in the last 8760 hours.   No results found.  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 Patient is less likely at this time patient has any active myeloma-although given renal insufficiency/anemia [see discussion below]  #Anemia hemoglobin 12 [Dr. Lateef]-suspect from chronic kidney disease rather than underlying myeloma.  Recommend iron studies.  # Chronic kidney disease-stage-III; unclear etiology pending work-up with Dr.Lateef.  #Intermittent fatigue-unclear etiology [daughter-lupus]-await above work-up; might need further referral to rheumatology.  Thank you Dr. Holley Raring for allowing me to participate in the care of your pleasant patient. Please do not hesitate to contact me with questions or concerns in the interim.  # DISPOSITION: # labs today-CBC; iron studies ferritin kappa lambda light chain;MM  Panel immunofixation. # follow up in 2 weeks- MD; No labs- dr.B  All questions were answered. The patient knows to call the  clinic with any problems, questions or concerns.    Cammie Sickle, MD 09/17/2021 1:45 PM

## 2021-09-17 NOTE — Assessment & Plan Note (Addendum)
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 Patient is less likely at this time patient has any active myeloma-although given renal insufficiency/anemia [see discussion below]  #Anemia hemoglobin 12 [Dr. Lateef]-suspect from chronic kidney disease rather than underlying myeloma.  Recommend iron studies.  # Chronic kidney disease-stage-III; unclear etiology pending work-up with Dr.Lateef.  #Intermittent fatigue-unclear etiology [daughter-lupus]-await above work-up; might need further referral to rheumatology.  Thank you Dr. Holley Raring for allowing me to participate in the care of your pleasant patient. Please do not hesitate to contact me with questions or concerns in the interim.  # DISPOSITION: # labs today-CBC; iron studies ferritin kappa lambda light chain;MM  Panel immunofixation. # follow up in 2 weeks- MD; No labs- dr.B

## 2021-09-18 LAB — KAPPA/LAMBDA LIGHT CHAINS
Kappa free light chain: 32.2 mg/L — ABNORMAL HIGH (ref 3.3–19.4)
Kappa, lambda light chain ratio: 2.46 — ABNORMAL HIGH (ref 0.26–1.65)
Lambda free light chains: 13.1 mg/L (ref 5.7–26.3)

## 2021-09-21 LAB — MULTIPLE MYELOMA PANEL, SERUM
Albumin SerPl Elph-Mcnc: 4 g/dL (ref 2.9–4.4)
Albumin/Glob SerPl: 1.3 (ref 0.7–1.7)
Alpha 1: 0.2 g/dL (ref 0.0–0.4)
Alpha2 Glob SerPl Elph-Mcnc: 0.8 g/dL (ref 0.4–1.0)
B-Globulin SerPl Elph-Mcnc: 1 g/dL (ref 0.7–1.3)
Gamma Glob SerPl Elph-Mcnc: 1.2 g/dL (ref 0.4–1.8)
Globulin, Total: 3.2 g/dL (ref 2.2–3.9)
IgA: 158 mg/dL (ref 61–437)
IgG (Immunoglobin G), Serum: 1182 mg/dL (ref 603–1613)
IgM (Immunoglobulin M), Srm: 81 mg/dL (ref 20–172)
M Protein SerPl Elph-Mcnc: 0.5 g/dL — ABNORMAL HIGH
Total Protein ELP: 7.2 g/dL (ref 6.0–8.5)

## 2021-10-02 ENCOUNTER — Inpatient Hospital Stay: Payer: Self-pay | Admitting: Internal Medicine

## 2021-10-06 ENCOUNTER — Encounter: Payer: Self-pay | Admitting: Internal Medicine

## 2021-10-06 ENCOUNTER — Other Ambulatory Visit: Payer: Self-pay

## 2021-10-06 ENCOUNTER — Inpatient Hospital Stay: Payer: Self-pay | Attending: Internal Medicine | Admitting: Internal Medicine

## 2021-10-06 VITALS — BP 130/83 | HR 69 | Temp 98.4°F | Resp 18 | Wt 168.6 lb

## 2021-10-06 DIAGNOSIS — D472 Monoclonal gammopathy: Secondary | ICD-10-CM | POA: Insufficient documentation

## 2021-10-06 DIAGNOSIS — N183 Chronic kidney disease, stage 3 unspecified: Secondary | ICD-10-CM | POA: Insufficient documentation

## 2021-10-06 DIAGNOSIS — R5383 Other fatigue: Secondary | ICD-10-CM | POA: Insufficient documentation

## 2021-10-06 NOTE — Assessment & Plan Note (Signed)
#  IgGK- 0.5 gm/dl; K/L= 2.45 [SEP 2022]- MGUS.-Not symptomatic myeloma-chemistries normal except for GFR 47/stage III kidney disease-see below.  I again had a long discussion with the patient regarding MGUS-long discussion with the patient regarding natural history of MGUS. small risk of progression to multiple myeloma.  Discussed that multiple myeloma cannot be cured at this time however good treatment options available, which includes autologous stem cell transplant.  However, at this time patient is clinically asymptomatic.  Monitor for now.  # Chronic kidney disease-stage-III; unclear etiology pending work-up with Dr.Lateef.  Do not suspect monoclonal disease to be causing patient's abnormal GFR at this time.  Will defer to Dr. Zollie Scale for further work-up/treatment plan.  Reviewed with the patient to avoid NSAIDs.  #Intermittent fatigue-unclear etiology [daughter-lupus]-again unlikely related to patient's MGUS.  Defer to PCP for further work-up/investigation.  # DISPOSITION: print out # follow up in 6 months- MD; 1 week prior- labs- cbc/cmp; MM panel; K/L light chains-  Dr.B  Cc; Dr.Lateef.

## 2021-10-06 NOTE — Patient Instructions (Signed)
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

## 2021-10-06 NOTE — Progress Notes (Signed)
[facility] CONSULT NOTE  Patient Care Team: Pcp, No as PCP - General  CHIEF COMPLAINTS/PURPOSE OF CONSULTATION: Monoclonal gammopathy  HEMATOLOGY HISTORY  # MGUS-MONOCLONAL GAMMOPATHY- AUG 2022- M protein- 0.5gm/dl [Dr.Lateef] incidental  2020-2021- CKD - III [[place] [employer]]; IgG kappa 0.5 g; kappa lambda light chain 2.65.  # CKD stage-III [Dr.Lateef; Back pain work injury- on PT; COVID [NOV 2021] ; 2018- peyronies s/p surgery.   HISTORY OF PRESENTING ILLNESS:  Marc Sutton 60 y.o.  male is here today with results of his blood work ordered for monoclonal gammopathy.  Patient continues to have intermittent fatigue.  Intermittent back pain not any worse.  Review of Systems  Constitutional:  Positive for malaise/fatigue. Negative for chills, diaphoresis, fever and weight loss.  HENT:  Negative for nosebleeds and sore throat.   Eyes:  Negative for double vision.  Respiratory:  Negative for cough, hemoptysis, sputum production, shortness of breath and wheezing.   Cardiovascular:  Negative for chest pain, palpitations, orthopnea and leg swelling.  Gastrointestinal:  Negative for abdominal pain, blood in stool, constipation, diarrhea, heartburn, melena, nausea and vomiting.  Genitourinary:  Negative for dysuria, frequency and urgency.  Musculoskeletal:  Positive for back pain and joint pain.  Skin: Negative.  Negative for itching and rash.  Neurological:  Negative for dizziness, tingling, focal weakness, weakness and headaches.  Endo/Heme/Allergies:  Does not bruise/bleed easily.  Psychiatric/Behavioral:  Negative for depression. The patient is not nervous/anxious and does not have insomnia.    MEDICAL HISTORY:  Past Medical History:  Diagnosis Date   Kidney disease     SURGICAL HISTORY: Past Surgical History:  Procedure Laterality Date   COLONOSCOPY     KNEE SURGERY  2006   left knee arthroscopy   VASECTOMY  1993    SOCIAL HISTORY: Social History    Socioeconomic History   Marital status: Married    Spouse name: Not on file   Number of children: Not on file   Years of education: Not on file   Highest education level: Not on file  Occupational History   Occupation: Nurse, adult  Tobacco Use   Smoking status: Never   Smokeless tobacco: Never  Vaping Use   Vaping Use: Never used  Substance and Sexual Activity   Alcohol use: Yes    Alcohol/week: 3.0 standard drinks    Types: 3 Standard drinks or equivalent per week    Comment: occasionally   Drug use: No   Sexual activity: Yes    Birth control/protection: None  Other Topics Concern   Not on file  Social History Narrative   L[place] in [place]; lies with wife; grown up son/daughter [lives close]; never smoked; no alcohol. Presently not working;  worked in [place] Strain: Not on [employer] Insecurity: Not on file  Transportation Needs: Not on file  Physical Activity: Not on file  Stress: Not on file  Social Connections: Not on file  Intimate Partner Violence: Not on file    FAMILY HISTORY: Family History  Problem Relation Age of Onset   Hypertension Mother    Clotting disorder Mother        "body does not make blood" has to get transfusions   Heart disease Father     ALLERGIES:  is allergic to codeine.  MEDICATIONS:  Current Outpatient Medications  Medication Sig Dispense Refill   aspirin 325 MG tablet Take 325 mg by mouth daily.  diclofenac (VOLTAREN) 75 MG EC tablet Take 1 tablet (75 mg total) by mouth 2 (two) times daily. (Patient not taking: Reported on 09/17/2021) 20 tablet 0   ibuprofen (ADVIL,MOTRIN) 600 MG tablet Take 600 mg by mouth every 6 (six) hours as needed.   (Patient not taking: Reported on 09/17/2021)     methocarbamol (ROBAXIN) 500 MG tablet Take 1 tablet (500 mg total) by mouth 4 (four) times daily. (Patient not taking: Reported on 09/17/2021) 20 tablet 0   No  current facility-administered medications for this visit.      PHYSICAL EXAMINATION:   Vitals:   10/06/21 0843  BP: 130/83  Pulse: 69  Resp: 18  Temp: 98.4 F (36.9 C)  SpO2: 100%   Filed Weights   10/06/21 0843  Weight: 168 lb 9.6 oz (76.5 kg)    Physical Exam Vitals and nursing note reviewed.  HENT:     Head: Normocephalic and atraumatic.     Mouth/Throat:     Pharynx: Oropharynx is clear.  Eyes:     Extraocular Movements: Extraocular movements intact.     Pupils: Pupils are equal, round, and reactive to light.  Cardiovascular:     Rate and Rhythm: Normal rate and regular rhythm.  Pulmonary:     Comments: Decreased breath sounds bilaterally.  Abdominal:     Palpations: Abdomen is soft.  Musculoskeletal:        General: Normal range of motion.     Cervical back: Normal range of motion.  Skin:    General: Skin is warm.  Neurological:     General: No focal deficit present.     Mental Status: He is alert and oriented to person, place, and time.  Psychiatric:        Behavior: Behavior normal.        Judgment: Judgment normal.   LABORATORY DATA:  I have reviewed the data as listed Lab Results  Component Value Date   WBC 6.6 09/17/2021   HGB 14.2 09/17/2021   HCT 42.1 09/17/2021   MCV 88.4 09/17/2021   PLT 274 09/17/2021   No results for input(s): NA, K, CL, CO2, GLUCOSE, BUN, CREATININE, CALCIUM, GFRNONAA, GFRAA, PROT, ALBUMIN, AST, ALT, ALKPHOS, BILITOT, BILIDIR, IBILI in the last 8760 hours.   No results found.  Monoclonal gammopathy #  IgGK- 0.5 gm/dl; K/L= 2.45 [SEP 2022]- MGUS.-Not symptomatic myeloma-chemistries normal except for GFR 47/stage III kidney disease-see below.  I again had a long discussion with the patient regarding MGUS-long discussion with the patient regarding natural history of MGUS. small risk of progression to multiple myeloma.  Discussed that multiple myeloma cannot be cured at this time however good treatment options available,  which includes autologous stem cell transplant.  However, at this time patient is clinically asymptomatic.  Monitor for now.  # Chronic kidney disease-stage-III; unclear etiology pending work-up with Dr.Lateef.  Do not suspect monoclonal disease to be causing patient's abnormal GFR at this time.  Will defer to Dr. Zollie Scale for further work-up/treatment plan.  Reviewed with the patient to avoid NSAIDs.  #Intermittent fatigue-unclear etiology [daughter-lupus]-again unlikely related to patient's MGUS.  Defer to PCP for further work-up/investigation.  # DISPOSITION: print out # follow up in 6 months- MD; 1 week prior- labs- cbc/cmp; MM panel; K/L light chains-  Dr.B  Cc; Dr.Lateef.   All questions were answered. The patient knows to call the clinic with any problems, questions or concerns.    Cammie Sickle, MD 10/06/2021 9:25 AM

## 2021-11-05 ENCOUNTER — Other Ambulatory Visit: Payer: Self-pay

## 2021-11-05 ENCOUNTER — Ambulatory Visit (INDEPENDENT_AMBULATORY_CARE_PROVIDER_SITE_OTHER): Payer: Self-pay | Admitting: Physician Assistant

## 2021-11-05 ENCOUNTER — Encounter: Payer: Self-pay | Admitting: Physician Assistant

## 2021-11-05 DIAGNOSIS — L570 Actinic keratosis: Secondary | ICD-10-CM

## 2021-11-05 DIAGNOSIS — Z85828 Personal history of other malignant neoplasm of skin: Secondary | ICD-10-CM

## 2021-11-05 DIAGNOSIS — Z1283 Encounter for screening for malignant neoplasm of skin: Secondary | ICD-10-CM

## 2021-11-05 DIAGNOSIS — Z808 Family history of malignant neoplasm of other organs or systems: Secondary | ICD-10-CM

## 2021-12-01 ENCOUNTER — Encounter: Payer: Self-pay | Admitting: Physician Assistant

## 2021-12-01 NOTE — Progress Notes (Signed)
   New Patient   Subjective  Marc Sutton is a 60 y.o. male who presents for the following: Annual Exam (Places on back- per wife- no really concerns. Personal history of non mole skin cancer, but no melanoma. Family history of non mole skin cancers. ).   The following portions of the chart were reviewed this encounter and updated as appropriate:  Tobacco  Allergies  Meds  Problems  Med Hx  Surg Hx  Fam Hx      Objective  Well appearing patient in no apparent distress; mood and affect are within normal limits.  All skin head to toe was examined.  head to toe No history of atypical moles   Dorsum of Nose, Left Anterior Mandible Erythematous patches with gritty scale.   Assessment & Plan  Screening exam for skin cancer head to toe  Yearly skin exams  AK (actinic keratosis) (2) Dorsum of Nose; Left Anterior Mandible  Destruction of lesion - Dorsum of Nose, Left Anterior Mandible Complexity: simple   Destruction method: cryotherapy   Informed consent: discussed and consent obtained   Timeout:  patient name, date of birth, surgical site, and procedure verified Lesion destroyed using liquid nitrogen: Yes   Cryotherapy cycles:  3 Outcome: patient tolerated procedure well with no complications   Post-procedure details: wound care instructions given       I, Yash Cacciola, PA-C, have reviewed all documentation's for this visit.  The documentation on 12/01/21 for the exam, diagnosis, procedures and orders are all accurate and complete.

## 2021-12-29 ENCOUNTER — Telehealth: Payer: Self-pay | Admitting: Physician Assistant

## 2021-12-29 NOTE — Telephone Encounter (Signed)
Patient says that he was told when he checked out that per [facility], PA-C that he would not receive another bill.  Patient states that he called [facility] and was told that he needed to speak with the administrator at [facility].

## 2021-12-29 NOTE — Telephone Encounter (Signed)
Patient is calling to say that he has received a bill for $120.40 and he says that he does not owe

## 2021-12-30 NOTE — Telephone Encounter (Signed)
Phone call to patient after reviewing the patients account with [facility] to let him know the charges are correct. The patient paid $77.40 on 11/05/21 which was for the office visit at a discounted rate. The patient also had 2 spots frozen and that's where the $120.40 balance is coming from. Patient said he did have 2 spots frozen but was under the impression when he left he paid in full. I apologized to the patient and told him I'd talk to my providers and remind them to make sure all charges are dropped before the patient leaves. Marc Sutton was fine and appreciated me calling him to explain where the bill was coming from because [facility] didn't want to explain anything to him, they told him to call us.

## 2022-01-04 ENCOUNTER — Telehealth: Payer: Self-pay | Admitting: Physician Assistant

## 2022-01-04 NOTE — Telephone Encounter (Signed)
Patient's wife Otila Kluver ((DPR on file)) calling in regards to patient's self pay bill. Patient called himself last week and Krystle double checked charges with provider and he was explained what happened but the charges were correct and he does owe the amount billed to our office. Per that TE the patient voiced understanding but the wife is still disputing this invoice. She refuses to pay the amount owed and states that her and her husband will not be back if they are going to be billed this remainder from patients visit in November. I informed her again of the message from previous TE and she is still not happy with this response and demands a call from the office manager even if it doesn't change anything. Patient states that she wants her complaint to be heard regardless.

## 2022-01-04 NOTE — Telephone Encounter (Signed)
Returned Tina's phone call and listened to her frustrations about the 2 charges that weren't dropped at the time of [place] new patient no insurance visit at checkout.  I explained to [place] like I did for [place] last week, Vida Roller dropped the charge for his office visit and not the charges for the 2 spots that were frozen, before he left that day. That is why they are receiving an extra bill. I explained to Otila Kluver, when a good faith estimate is given it's only is for what we know at the time of scheduling the appointment or at check-in. He was given an estimate for the office visit only, we don't know if something needs to be frozen until they see the provider.  Kelli didn't have the charges dropped before he left his visit and was apologetic as I expressed to [place] last week and he understood. However, I explained to [place] the services were done so the charges are correct and won't be removed. Otila Kluver said she probably won't pay the bill but will talk with her husband about it.

## 2022-03-30 ENCOUNTER — Inpatient Hospital Stay: Payer: BLUE CROSS/BLUE SHIELD | Attending: Internal Medicine

## 2022-03-30 DIAGNOSIS — N183 Chronic kidney disease, stage 3 unspecified: Secondary | ICD-10-CM | POA: Insufficient documentation

## 2022-03-30 DIAGNOSIS — D472 Monoclonal gammopathy: Secondary | ICD-10-CM | POA: Diagnosis present

## 2022-03-30 LAB — COMPREHENSIVE METABOLIC PANEL
ALT: 21 U/L (ref 0–44)
AST: 23 U/L (ref 15–41)
Albumin: 4.3 g/dL (ref 3.5–5.0)
Alkaline Phosphatase: 57 U/L (ref 38–126)
Anion gap: 4 — ABNORMAL LOW (ref 5–15)
BUN: 20 mg/dL (ref 8–23)
CO2: 24 mmol/L (ref 22–32)
Calcium: 9 mg/dL (ref 8.9–10.3)
Chloride: 109 mmol/L (ref 98–111)
Creatinine, Ser: 1.52 mg/dL — ABNORMAL HIGH (ref 0.61–1.24)
GFR, Estimated: 51 mL/min — ABNORMAL LOW (ref >60)
Glucose, Bld: 100 mg/dL — ABNORMAL HIGH (ref 70–99)
Potassium: 4.3 mmol/L (ref 3.5–5.1)
Sodium: 137 mmol/L (ref 135–145)
Total Bilirubin: 0.5 mg/dL (ref 0.3–1.2)
Total Protein: 7.5 g/dL (ref 6.5–8.1)

## 2022-03-30 LAB — CBC WITH DIFFERENTIAL/PLATELET
Abs Immature Granulocytes: 0.01 10*3/uL (ref 0.00–0.07)
Basophils Absolute: 0.1 10*3/uL (ref 0.0–0.1)
Basophils Relative: 1 %
Eosinophils Absolute: 0.2 10*3/uL (ref 0.0–0.5)
Eosinophils Relative: 5 %
HCT: 40.3 % (ref 39.0–52.0)
Hemoglobin: 13.3 g/dL (ref 13.0–17.0)
Immature Granulocytes: 0 %
Lymphocytes Relative: 25 %
Lymphs Abs: 1.2 10*3/uL (ref 0.7–4.0)
MCH: 29.8 pg (ref 26.0–34.0)
MCHC: 33 g/dL (ref 30.0–36.0)
MCV: 90.4 fL (ref 80.0–100.0)
Monocytes Absolute: 0.5 10*3/uL (ref 0.1–1.0)
Monocytes Relative: 10 %
Neutro Abs: 2.9 10*3/uL (ref 1.7–7.7)
Neutrophils Relative %: 59 %
Platelets: 232 10*3/uL (ref 150–400)
RBC: 4.46 MIL/uL (ref 4.22–5.81)
RDW: 12.3 % (ref 11.5–15.5)
WBC: 4.8 10*3/uL (ref 4.0–10.5)
nRBC: 0 % (ref 0.0–0.2)

## 2022-03-31 LAB — KAPPA/LAMBDA LIGHT CHAINS
Kappa free light chain: 31.4 mg/L — ABNORMAL HIGH (ref 3.3–19.4)
Kappa, lambda light chain ratio: 2.33 — ABNORMAL HIGH (ref 0.26–1.65)
Lambda free light chains: 13.5 mg/L (ref 5.7–26.3)

## 2022-04-06 ENCOUNTER — Encounter: Payer: Self-pay | Admitting: Internal Medicine

## 2022-04-06 ENCOUNTER — Telehealth: Payer: Self-pay | Admitting: Internal Medicine

## 2022-04-06 ENCOUNTER — Inpatient Hospital Stay (HOSPITAL_BASED_OUTPATIENT_CLINIC_OR_DEPARTMENT_OTHER): Payer: BLUE CROSS/BLUE SHIELD | Admitting: Internal Medicine

## 2022-04-06 DIAGNOSIS — D472 Monoclonal gammopathy: Secondary | ICD-10-CM | POA: Diagnosis not present

## 2022-04-06 LAB — MULTIPLE MYELOMA PANEL, SERUM
Albumin SerPl Elph-Mcnc: 4.1 g/dL (ref 2.9–4.4)
Albumin/Glob SerPl: 1.5 (ref 0.7–1.7)
Alpha 1: 0.2 g/dL (ref 0.0–0.4)
Alpha2 Glob SerPl Elph-Mcnc: 0.6 g/dL (ref 0.4–1.0)
B-Globulin SerPl Elph-Mcnc: 0.8 g/dL (ref 0.7–1.3)
Gamma Glob SerPl Elph-Mcnc: 1.1 g/dL (ref 0.4–1.8)
Globulin, Total: 2.8 g/dL (ref 2.2–3.9)
IgA: 147 mg/dL (ref 61–437)
IgG (Immunoglobin G), Serum: 1309 mg/dL (ref 603–1613)
IgM (Immunoglobulin M), Srm: 84 mg/dL (ref 20–172)
M Protein SerPl Elph-Mcnc: 0.6 g/dL — ABNORMAL HIGH
Total Protein ELP: 6.9 g/dL (ref 6.0–8.5)

## 2022-04-06 NOTE — Assessment & Plan Note (Addendum)
#  IgGK- 0.5 gm/dl; K/L= 2.45 [SEP 2022]- MGUS.-Not symptomatic myeloma-chemistries normal except for GFR 47-51/stage III kidney disease-see below.  March 2023-kappa lambda light chain ratio stable.  Myeloma panel pending.  Will call with results. ? ?I again had a long discussion with the patient regarding MGUS-long discussion with the patient regarding natural history of MGUS. small risk of progression to multiple myeloma. However, at this time patient is clinically asymptomatic.  Monitor for now. ? ?# Chronic kidney disease-stage-III [GFR -51]; unclear etiology pending work-up with Dr.Lateef.  Do not suspect monoclonal disease to be causing patient's abnormal GFR at this time.  Continue to follow up Dr. Zollie Scale for further work-up/treatment plan.  Reviewed with the patient to avoid NSAIDs. ? ?* will call with results  ?# DISPOSITION:  ?# follow up in 6 months- MD; 2 week prior- labs- cbc/cmp; MM panel; K/L light chains-  Dr.B ? ?Cc; Dr.Lateef.  ?

## 2022-04-06 NOTE — Progress Notes (Signed)
[facility] ?CONSULT NOTE ? ?Patient Care Team: ?Pcp, No as PCP - General ? ?CHIEF COMPLAINTS/PURPOSE OF CONSULTATION: Monoclonal gammopathy ? ?HEMATOLOGY HISTORY ? ?# MGUS-MONOCLONAL GAMMOPATHY- AUG 2022- M protein- 0.5gm/dl [Dr.Lateef] incidental  2020-2021- CKD - III [[place] [employer]]; IgG kappa 0.5 g; kappa lambda light chain 2.65. ? ?# CKD stage-III [Dr.Lateef; Back pain work injury- on PT; [place] [NOV 2021] ; 2018- peyronies s/p surgery.  ? ?HISTORY OF PRESENTING ILLNESS: Ambulating independently.  Accompanied by his wife. ? ?Marc Sutton 61 y.o.  male is here today with results of his blood work ordered for monoclonal gammopathy. ? ?Chronic intermittent back pain not any worse.  Otherwise fairly active. ? ?Review of Systems  ?Constitutional:  Positive for malaise/fatigue. Negative for chills, diaphoresis, fever and weight loss.  ?HENT:  Negative for nosebleeds and sore throat.   ?Eyes:  Negative for double vision.  ?Respiratory:  Negative for cough, hemoptysis, sputum production, shortness of breath and wheezing.   ?Cardiovascular:  Negative for chest pain, palpitations, orthopnea and leg swelling.  ?Gastrointestinal:  Negative for abdominal pain, blood in stool, constipation, diarrhea, heartburn, melena, nausea and vomiting.  ?Genitourinary:  Negative for dysuria, frequency and urgency.  ?Musculoskeletal:  Positive for back pain and joint pain.  ?Skin: Negative.  Negative for itching and rash.  ?Neurological:  Negative for dizziness, tingling, focal weakness, weakness and headaches.  ?Endo/Heme/Allergies:  Does not bruise/bleed easily.  ?Psychiatric/Behavioral:  Negative for depression. The patient is not nervous/anxious and does not have insomnia.   ? ?MEDICAL HISTORY:  ?Past Medical History:  ?Diagnosis Date  ? Kidney disease   ? ? ?SURGICAL HISTORY: ?Past Surgical History:  ?Procedure Laterality Date  ? COLONOSCOPY    ? KNEE SURGERY  2006  ? left knee arthroscopy  ? VASECTOMY  1993   ? ? ?SOCIAL HISTORY: ?Social History  ? ?Socioeconomic History  ? Marital status: Married  ?  Spouse name: Not on file  ? Number of children: Not on file  ? Years of education: Not on file  ? Highest education level: Not on file  ?Occupational History  ? Occupation: Nurse, adult  ?Tobacco Use  ? Smoking status: Never  ? Smokeless tobacco: Never  ?Vaping Use  ? Vaping Use: Never used  ?Substance and Sexual Activity  ? Alcohol use: Yes  ?  Alcohol/week: 3.0 standard drinks  ?  Types: 3 Standard drinks or equivalent per week  ?  Comment: occasionally  ? Drug use: No  ? Sexual activity: Yes  ?  Birth control/protection: None  ?Other Topics Concern  ? Not on file  ?Social History Narrative  ? Lives in [facility]; lies with wife; grown up son/daughter [lives close]; never smoked; no alcohol. Presently not working;  worked in [employer]  ? ?Social Determinants of Health  ? ?Financial Resource Strain: Not on file  ?Food Insecurity: Not on file  ?Transportation Needs: Not on file  ?Physical Activity: Not on file  ?Stress: Not on file  ?Social Connections: Not on file  ?Intimate Partner Violence: Not on file  ? ? ?FAMILY HISTORY: ?Family History  ?Problem Relation Age of Onset  ? Hypertension Mother   ? Clotting disorder Mother   ?     "body does not make blood" has to get transfusions  ? Heart disease Father   ? ? ?ALLERGIES:  is allergic to codeine. ? ?MEDICATIONS:  ?Current Outpatient Medications  ?Medication Sig Dispense Refill  ? aspirin 325 MG tablet Take  325 mg by mouth daily.    ? ?No current facility-administered medications for this visit.  ? ? ? ? ?PHYSICAL EXAMINATION: ? ? ?Vitals:  ? 04/06/22 1006  ?BP: (!) 148/77  ?Pulse: (!) 50  ?Temp: (!) 96.6 ?F (35.9 ?C)  ?SpO2: 100%  ? ?Filed Weights  ? 04/06/22 1006  ?Weight: 160 lb (72.6 kg)  ? ? ?Physical Exam ?Vitals and nursing note reviewed.  ?HENT:  ?   Head: Normocephalic and atraumatic.  ?   Mouth/Throat:  ?   Pharynx: Oropharynx is clear.   ?Eyes:  ?   Extraocular Movements: Extraocular movements intact.  ?   Pupils: Pupils are equal, round, and reactive to light.  ?Cardiovascular:  ?   Rate and Rhythm: Normal rate and regular rhythm.  ?Pulmonary:  ?   Comments: Decreased breath sounds bilaterally.  ?Abdominal:  ?   Palpations: Abdomen is soft.  ?Musculoskeletal:     ?   General: Normal range of motion.  ?   Cervical back: Normal range of motion.  ?Skin: ?   General: Skin is warm.  ?Neurological:  ?   General: No focal deficit present.  ?   Mental Status: He is alert and oriented to person, place, and time.  ?Psychiatric:     ?   Behavior: Behavior normal.     ?   Judgment: Judgment normal.  ? ?LABORATORY DATA:  ?I have reviewed the data as listed ?Lab Results  ?Component Value Date  ? WBC 4.8 03/30/2022  ? HGB 13.3 03/30/2022  ? HCT 40.3 03/30/2022  ? MCV 90.4 03/30/2022  ? PLT 232 03/30/2022  ? ?Recent Labs  ?  03/30/22 ?0910  ?NA 137  ?K 4.3  ?CL 109  ?CO2 24  ?GLUCOSE 100*  ?BUN 20  ?CREATININE 1.52*  ?CALCIUM 9.0  ?GFRNONAA 51*  ?PROT 7.5  ?ALBUMIN 4.3  ?AST 23  ?ALT 21  ?ALKPHOS 57  ?BILITOT 0.5  ? ? ? ?No results found. ? ?Monoclonal gammopathy ?#  IgGK- 0.5 gm/dl; K/L= 2.45 [SEP 2022]- MGUS.-Not symptomatic myeloma-chemistries normal except for GFR 47-51/stage III kidney disease-see below.  March 2023-kappa lambda light chain ratio stable.  Myeloma panel pending.  Will call with results. ? ?I again had a long discussion with the patient regarding MGUS-long discussion with the patient regarding natural history of MGUS. small risk of progression to multiple myeloma. However, at this time patient is clinically asymptomatic.  Monitor for now. ? ?# Chronic kidney disease-stage-III [GFR -51]; unclear etiology pending work-up with Dr.Lateef.  Do not suspect monoclonal disease to be causing patient's abnormal GFR at this time.  Continue to follow up Dr. Zollie Scale for further work-up/treatment plan.  Reviewed with the patient to avoid NSAIDs. ? ?* will  call with results  ?# DISPOSITION:  ?# follow up in 6 months- MD; 2 week prior- labs- cbc/cmp; MM panel; K/L light chains-  Dr.B ? ?Cc; Dr.Lateef.  ? ?All questions were answered. The patient knows to call the clinic with any problems, questions or concerns. ?  ? Cammie Sickle, MD ?04/06/2022 10:37 AM ? ? ? ?

## 2022-04-06 NOTE — Telephone Encounter (Signed)
FYI-  ? ?Unable to reach the patient after voicemail with the results of the M protein; slightly elevated at 0.6 but overall stable.  Recommend continue monitoring every 6 months.  Recommend call us back if any questions. ?

## 2022-04-06 NOTE — Addendum Note (Signed)
Addended by: Leeann Must on: 04/06/2022 11:03 AM ? ? Modules accepted: Orders ? ?

## 2022-04-07 NOTE — Telephone Encounter (Signed)
Noted  

## 2022-09-25 IMAGING — MR MR CERVICAL SPINE W/O CM
4 of 5 series · 29 of 48 positions shown · non-contrast
Comparison: None.

CLINICAL DATA: Cervicalgia RDH.5 (XYX-QD-CM)

EXAM:
MRI CERVICAL SPINE WITHOUT CONTRAST
TECHNIQUE: Multiplanar, multisequence MR imaging of the cervical spine was
performed. No intravenous contrast was administered.

[Series 3: T2 · sagittal · 3.0mm · 0.66mm/px · 7 of 16 slices shown (1 of 2)]
[im 1/16]
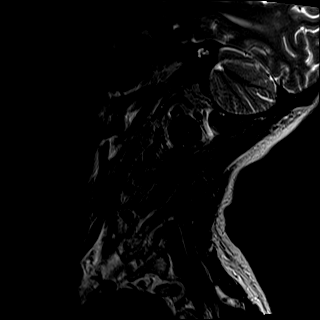
[im 3/16]
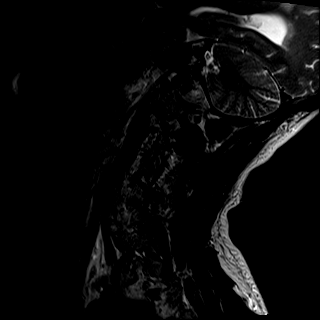
[im 6/16]
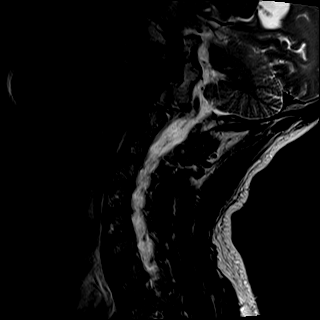
[im 8/16]
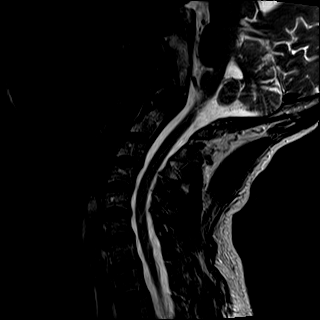
[im 11/16]
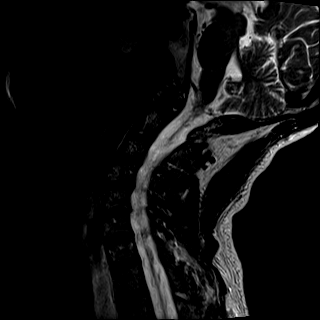
[im 13/16]
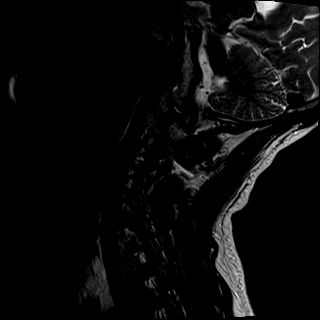
[im 16/16]
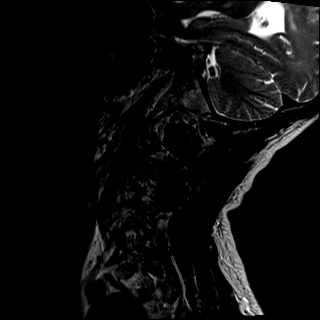

[Series 4: T1 · sagittal · 3.0mm · 0.41mm/px · 8 of 16 slices shown]
[im 1/16]
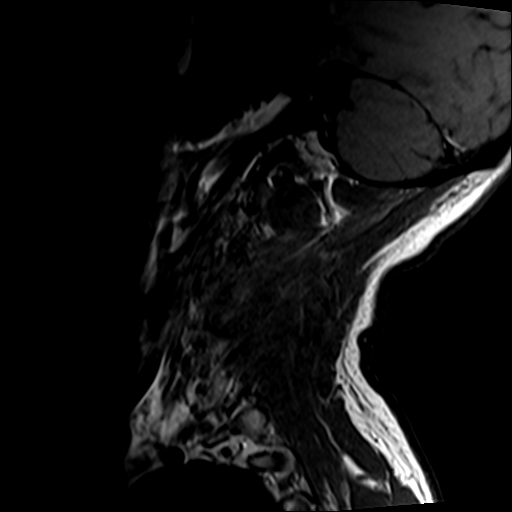
[im 3/16]
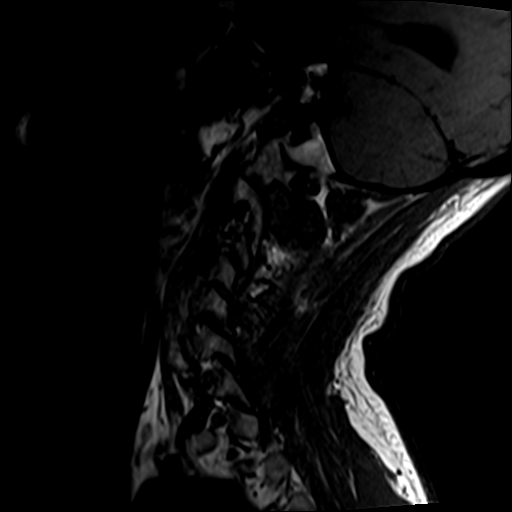
[im 5/16]
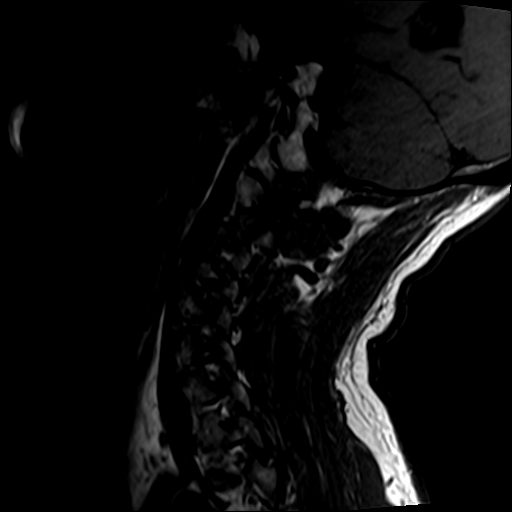
[im 7/16]
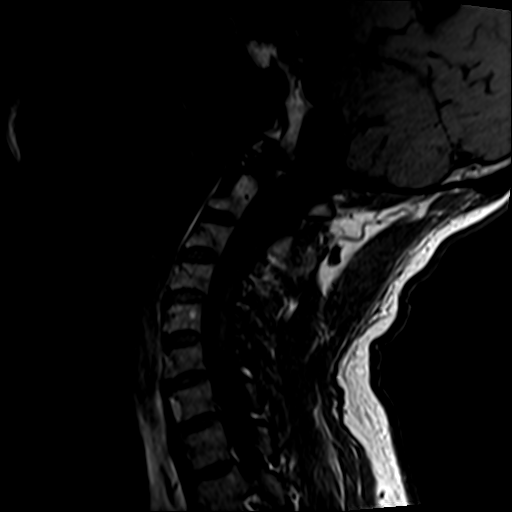
[im 9/16]
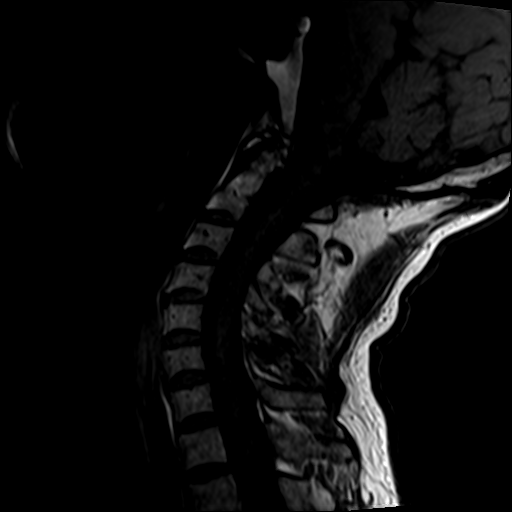
[im 11/16]
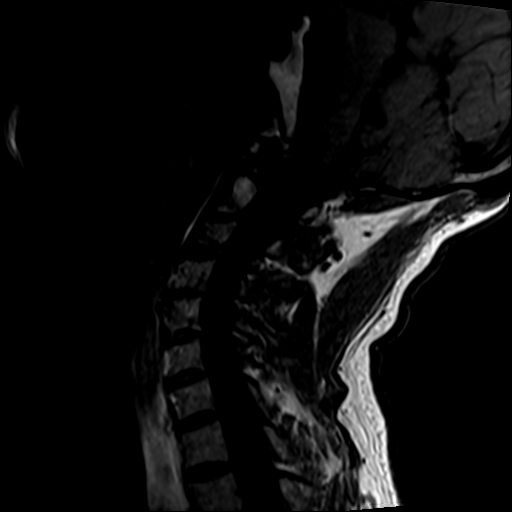
[im 13/16]
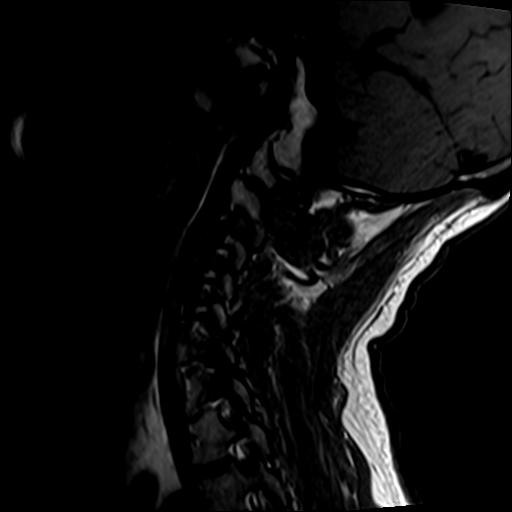
[im 16/16]
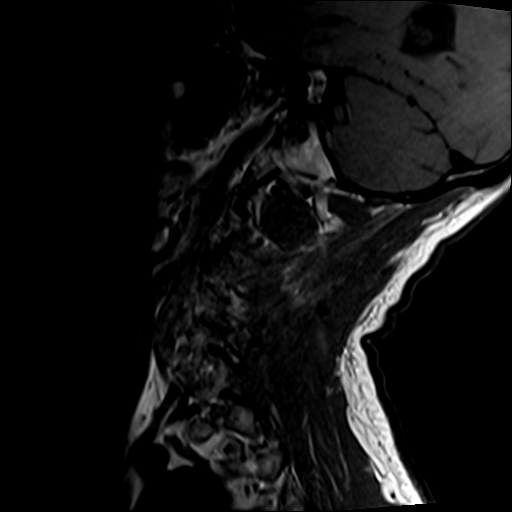

[Series 5: tir sag · sagittal · 3.0mm · 0.41mm/px · 5 of 16 slices shown]
[im 1/16]
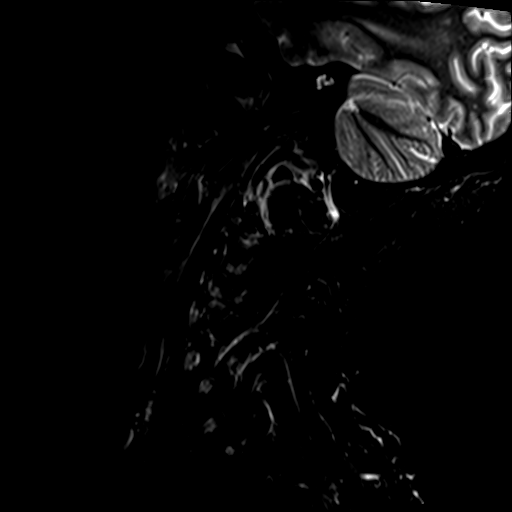
[im 3/16]
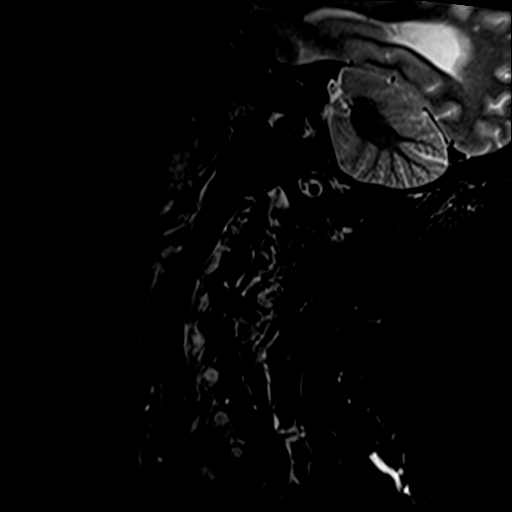
[im 5/16]
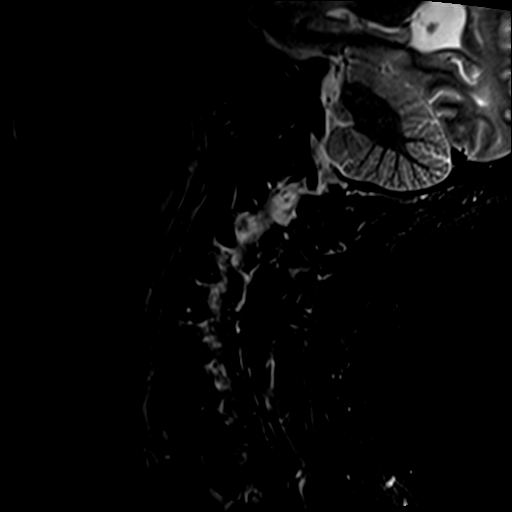
[im 9/16]
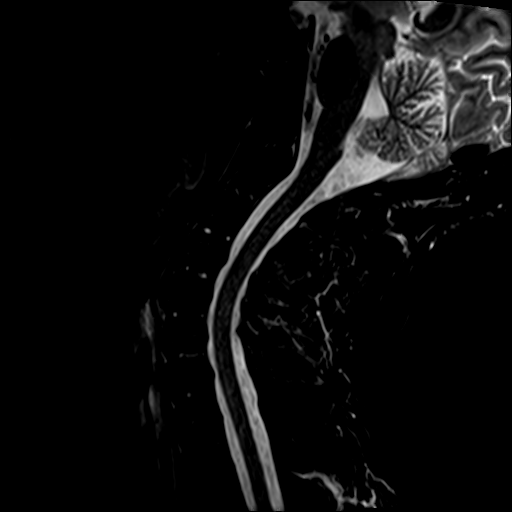
[im 13/16]
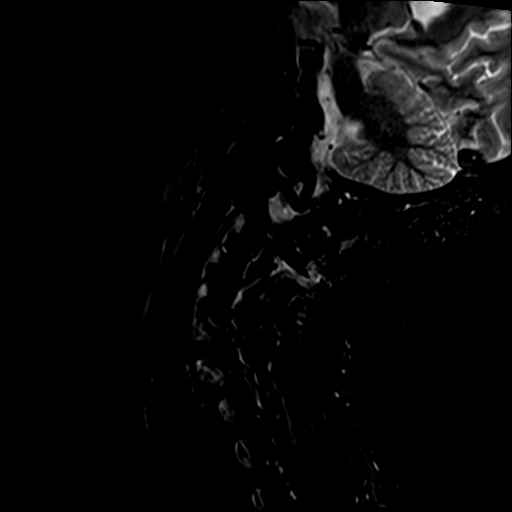

[Series 7: T2 · axial · 3.0mm · 0.70mm/px · z∈[-95,+6]mm · 9 of 25 slices shown (2 of 2)]
[im 1/25]
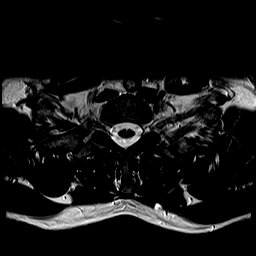
[im 5/25]
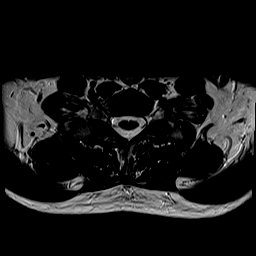
[im 7/25]
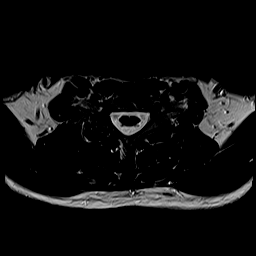
[im 11/25]
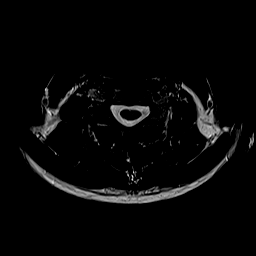
[im 14/25]
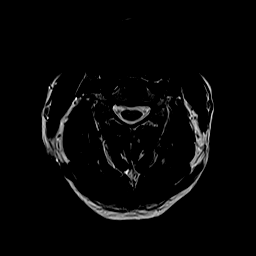
[im 18/25]
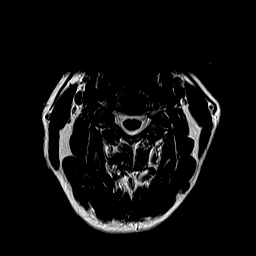
[im 20/25]
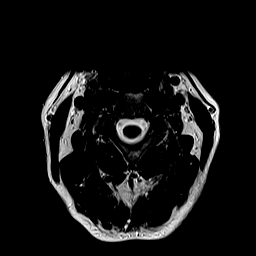
[im 22/25]
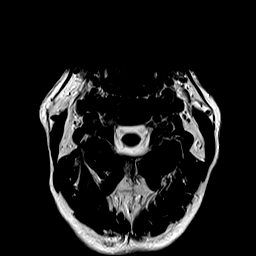
[im 25/25]
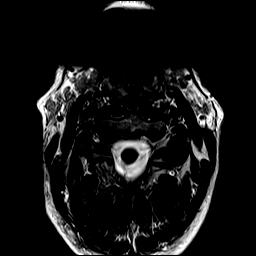

[29 of 48 positions shown; findings below may reference images not displayed]

FINDINGS: Alignment: No substantial sagittal subluxation.

Vertebrae: No specific evidence of acute fracture,
discitis/osteomyelitis or suspicious bone lesion.

Cord: Normal cord signal.

Posterior Fossa, vertebral arteries, paraspinal tissues: Visualized
vertebral artery flow voids are maintained. The visualized posterior
fossa is unremarkable on limited sagittal assessment. No posterior
paraspinal soft tissue edema.

Disc levels:

C2-C3: No significant disc protrusion, foraminal stenosis, or canal
stenosis.

C3-C4: Small posterior disc osteophyte complex and mild right
facet/uncovertebral hypertrophy. Resulting mild right foraminal
stenosis without significant canal or left foraminal stenosis.

C4-C5: Mild posterior disc osteophyte complex and mild right-sided
facet/uncovertebral hypertrophy. Resulting mild right foraminal
stenosis without significant canal or left foraminal stenosis.

C5-C6: Small posterior disc osteophyte complex without significant
canal or foraminal stenosis.

C6-C7: Small posterior disc osteophyte complex and mild facet
arthropathy without significant canal or foraminal stenosis.

C7-T1: No significant disc protrusion, foraminal stenosis, or canal
stenosis.
IMPRESSION: Mild right foraminal stenosis at C3-C4 and C4-C5. No significant
canal stenosis.

## 2022-09-29 ENCOUNTER — Other Ambulatory Visit: Payer: BLUE CROSS/BLUE SHIELD

## 2022-10-13 ENCOUNTER — Ambulatory Visit: Payer: BLUE CROSS/BLUE SHIELD | Admitting: Internal Medicine

## 2022-10-27 DIAGNOSIS — D472 Monoclonal gammopathy: Secondary | ICD-10-CM | POA: Insufficient documentation

## 2022-12-03 DIAGNOSIS — I25110 Atherosclerotic heart disease of native coronary artery with unstable angina pectoris: Secondary | ICD-10-CM | POA: Insufficient documentation

## 2022-12-03 DIAGNOSIS — I2510 Atherosclerotic heart disease of native coronary artery without angina pectoris: Secondary | ICD-10-CM | POA: Insufficient documentation

## 2022-12-28 DIAGNOSIS — Z85828 Personal history of other malignant neoplasm of skin: Secondary | ICD-10-CM | POA: Insufficient documentation

## 2022-12-29 DIAGNOSIS — E7800 Pure hypercholesterolemia, unspecified: Secondary | ICD-10-CM | POA: Insufficient documentation

## 2023-05-10 ENCOUNTER — Ambulatory Visit: Payer: Self-pay | Admitting: Podiatry

## 2023-05-10 DIAGNOSIS — L603 Nail dystrophy: Secondary | ICD-10-CM | POA: Diagnosis not present

## 2023-05-10 DIAGNOSIS — L600 Ingrowing nail: Secondary | ICD-10-CM | POA: Diagnosis not present

## 2023-05-10 NOTE — Patient Instructions (Signed)

## 2023-05-10 NOTE — Progress Notes (Signed)
  Subjective:  Patient ID: Marc Sutton, male    DOB: 11/06/1961,  MRN: [id number]  Chief Complaint  Patient presents with   Foot Problem    Patient here for a nail deformity, patient stated that his nail has grown back wrong and would like to have it evaluated, left great hallux, no prior treatment     62 y.o. male presents for left hallux nail dystrophy.  He has stated that the nail came off at some point in the past due to trauma to the nail.  Is never grown back since and it causes issues and pain snagging on socks because of that sharpness and curved up at the end.  Past Medical History:  Diagnosis Date   Kidney disease     Allergies  Allergen Reactions   Codeine Nausea And Vomiting    ROS: Negative except as per HPI above  Objective:  General: AAO x3, NAD  Dermatological: Left hallux nail with dystrophy fungal nail infectionAnd pain due to the incurvation of the nail  Vascular:  Dorsalis Pedis artery and Posterior Tibial artery pedal pulses are 2/4 bilateral.  Capillary fill time < 3 sec to all digits.   Neruologic: Grossly intact via light touch bilateral. Protective threshold intact to all sites bilateral.   Musculoskeletal: No gross boney pedal deformities bilateral. No pain, crepitus, or limitation noted with foot and ankle range of motion bilateral. Muscular strength 5/5 in all groups tested bilateral.  Gait: Unassisted, Nonantalgic.   No images are attached to the encounter.   Assessment:   1. Ingrown nail of great toe of left foot   2. Onychodystrophy      Plan:  Patient was evaluated and treated and all questions answered.    Ingrown Nail, left -Patient elects to proceed with minor surgery to remove ingrown toenail today. Consent reviewed and signed by patient. -Ingrown nail excised. See procedure note. -Educated on post-procedure care including soaking. Written instructions provided and reviewed. -Patient to follow up in 2 weeks for nail  check.  Procedure: Excision of Ingrown Toenail Location: Left 1st toe  total  nail borders. Anesthesia: Lidocaine 1% plain; 1.5 mL and Marcaine 0.5% plain; 1.5 mL, digital block. Skin Prep: Betadine. Dressing: Silvadene; telfa; dry, sterile, compression dressing. Technique: Following skin prep, the toe was exsanguinated and a tourniquet was secured at the base of the toe. The affected nail border was freed, split with a nail splitter, and excised. Chemical matrixectomy was then performed with phenol and irrigated out with alcohol. The tourniquet was then removed and sterile dressing applied. Disposition: Patient tolerated procedure well. Patient to return in 2 weeks for follow-up.            Corinna Gab, DPM [facility] / [facility]

## 2023-05-20 ENCOUNTER — Telehealth: Payer: Self-pay | Admitting: Podiatry

## 2023-05-20 NOTE — Telephone Encounter (Signed)
Pt said to cxl appt for 6.3 as he will be out of town and will call to reschedule.

## 2023-05-30 ENCOUNTER — Ambulatory Visit: Payer: Self-pay | Admitting: Podiatry

## 2023-08-04 ENCOUNTER — Encounter: Payer: Self-pay | Admitting: Emergency Medicine

## 2023-08-04 ENCOUNTER — Ambulatory Visit
Admission: EM | Admit: 2023-08-04 | Discharge: 2023-08-04 | Disposition: A | Discharge status: Home / Self Care | Payer: BC Managed Care – PPO | Attending: Nurse Practitioner | Admitting: Nurse Practitioner

## 2023-08-04 ENCOUNTER — Ambulatory Visit: Payer: BC Managed Care – PPO

## 2023-08-04 DIAGNOSIS — J209 Acute bronchitis, unspecified: Secondary | ICD-10-CM | POA: Diagnosis not present

## 2023-08-04 DIAGNOSIS — R059 Cough, unspecified: Secondary | ICD-10-CM

## 2023-08-04 HISTORY — DX: Heart disease, unspecified: I51.9

## 2023-08-04 MED ORDER — PREDNISONE 50 MG PO TABS: ORAL_TABLET | ORAL | 0 refills | Status: DC

## 2023-08-04 MED ORDER — PSEUDOEPH-BROMPHEN-DM 30-2-10 MG/5ML PO SYRP: 5.0000 mL | ORAL_SOLUTION | Freq: Four times a day (QID) | ORAL | 0 refills | Status: DC | PRN

## 2023-08-04 NOTE — ED Provider Notes (Signed)
[facility]    CSN: [id number] Arrival date & time: 08/04/23  1434      History   Chief Complaint No chief complaint on file.   HPI Marc Sutton is a 62 y.o. male.   The history is provided by the patient.   The patient presents for complaints of productive cough that is been present for the past 2 weeks.  Patient was seen at another urgent care, tested negative for COVID and flu, and was treated with amoxicillin for 7 days.  Patient states he finished the antibiotic earlier this week.  Patient denies fever, chills, headache, ear pain, sore throat, difficulty breathing, chest pain, abdominal pain, nausea, vomiting, or diarrhea.  Patient reports that he has been wheezing and has had some mild shortness of breath.  He reports that he has not taken any additional medication for his symptoms. 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 Authorizing Provider  brompheniramine-pseudoephedrine-DM 30-2-10 MG/5ML syrup Take 5 mLs by mouth 4 (four) times daily as needed. 08/04/23  Yes -Warren, Sadie Haber, NP  predniSONE (DELTASONE) 50 MG tablet Take 1 tablet daily with breakfast for the next 5 days. 08/04/23  Yes -Warren, Sadie Haber, NP  aspirin 325 MG tablet Take 325 mg by mouth daily.    [provider]  atorvastatin (LIPITOR) 80 MG tablet Take 80 mg by mouth daily.    [provider]    Family History Family History  Problem Relation Age of Onset   Hypertension Mother    Clotting disorder Mother        "body does not make blood" has to get transfusions    Heart disease Father     Social History Social History   Tobacco Use   Smoking status: Never   Smokeless tobacco: Never  Vaping Use   Vaping status: Never Used  Substance Use Topics   Alcohol use: Yes    Alcohol/week: 3.0 standard drinks of alcohol    Types: 3 Standard drinks or equivalent per week    Comment: occasionally   Drug use: No     Allergies   Codeine   Review of Systems Review of Systems Per HPI  Physical Exam Triage Vital Signs ED Triage Vitals  Encounter Vitals Group     BP 08/04/23 1447 110/67     Systolic BP Percentile --      Diastolic BP Percentile --      Pulse Rate 08/04/23 1447 91     Resp 08/04/23 1447 18     Temp 08/04/23 1447 98.7 F (37.1 C)     Temp Source 08/04/23 1447 Oral     SpO2 08/04/23 1447 98 %     Weight --      Height --      Head Circumference --      Peak Flow --      Pain Score 08/04/23 1450 0     Pain Loc --      Pain Education --  Exclude from Growth Chart --    No data found.  Updated Vital Signs BP 110/67 (BP Location: Right Arm)   Pulse 91   Temp 98.7 F (37.1 C) (Oral)   Resp 18   SpO2 98%   Visual Acuity Right Eye Distance:   Left Eye Distance:   Bilateral Distance:    Right Eye Near:   Left Eye Near:    Bilateral Near:     Physical Exam Vitals and nursing note reviewed.  Constitutional:      General: He is not in acute distress.    Appearance: Normal appearance.  HENT:     Head: Normocephalic.     Right Ear: Tympanic membrane, ear canal and external ear normal.     Left Ear: Tympanic membrane, ear canal and external ear normal.     Nose: Nose normal.     Mouth/Throat:     Mouth: Mucous membranes are moist.     Pharynx: Posterior oropharyngeal erythema present.  Eyes:     Extraocular Movements: Extraocular movements intact.     Conjunctiva/sclera: Conjunctivae normal.     Pupils: Pupils are equal, round, and reactive to light.  Cardiovascular:     Rate and Rhythm: Normal rate and  regular rhythm.     Pulses: Normal pulses.     Heart sounds: Normal heart sounds.  Pulmonary:     Effort: Pulmonary effort is normal.     Breath sounds: Rhonchi (Clears with coughing) present.  Abdominal:     General: Bowel sounds are normal.     Palpations: Abdomen is soft.     Tenderness: There is no abdominal tenderness.  Musculoskeletal:     Cervical back: Normal range of motion.  Skin:    General: Skin is warm and dry.  Neurological:     General: No focal deficit present.     Mental Status: He is alert and oriented to person, place, and time.  Psychiatric:        Mood and Affect: Mood normal.        Behavior: Behavior normal.      UC Treatments / Results  Labs (all labs ordered are listed, but only abnormal results are displayed) Labs Reviewed - No data to display  EKG   Radiology DG Chest 2 View  Result Date: 08/04/2023 CLINICAL DATA:  Cough and shortness of breath EXAM: CHEST - 2 VIEW COMPARISON:  Chest x-ray 09/30/2020 FINDINGS: The heart size and mediastinal contours are within normal limits. Both lungs are clear. The visualized skeletal structures are unremarkable. IMPRESSION: No active cardiopulmonary disease. Electronically Signed   By: Darliss Cheney M.D.   On: 08/04/2023 15:13    Procedures Procedures (including critical care time)  Medications Ordered in UC Medications - No data to display  Initial Impression / Assessment and Plan / UC Course  I have reviewed the triage vital signs and the nursing notes.  Pertinent labs & imaging results that were available during my care of the patient were reviewed by me and considered in my medical decision making (see chart for details).  The patient is well-appearing, he is in no acute distress, vital signs are stable.  Patient continues with productive cough.  He has not been febrile, he does have rhonchi on exam, but rhonchi clears with cough.  Patient has completed 1 round of amoxicillin.  Chest x-ray was  negative for active cardiopulmonary disease.  Symptoms appear to be consistent with acute bronchitis at this time.  Cough has  been persistent for 2 weeks.  Will treat patient with prednisone 50 mg for the next 5 days for bronchial inflammation.  Patient also prescribed Bromfed-DM for cough.  Supportive care recommendations were provided and discussed with the patient to include increasing fluids, allowing for plenty of rest, and use of a humidifier in the bedroom at nighttime during sleep.  Patient is in agreement with this plan of care and verbalizes understanding.  All questions were answered.  Patient is stable for discharge.  Final Clinical Impressions(s) / UC Diagnoses   Final diagnoses:  Acute bronchitis, unspecified organism     Discharge Instructions      The chest x-ray is negative for pneumonia. Take medication as prescribed. Increase fluids and allow for plenty of rest. Recommend using a humidifier in your bedroom at nighttime during sleep and sleeping elevated on pillows. Recommend using cough drops throughout the day and drinking plenty of fluids to help with your cough. If you are continuing to experience a cough, but are feeling well, continue increasing your fluid intake and use of over-the-counter cough drops or throat lozenges.  If you continue to experience a cough with new symptoms of fever, wheezing, shortness of breath, or difficulty breathing, please follow-up in this clinic or with your primary care physician for further evaluation. Follow-up as needed.     ED Prescriptions     Medication Sig Dispense Auth. Provider   predniSONE (DELTASONE) 50 MG tablet Take 1 tablet daily with breakfast for the next 5 days. 5 tablet -Warren, Sadie Haber, NP   brompheniramine-pseudoephedrine-DM 30-2-10 MG/5ML syrup Take 5 mLs by mouth 4 (four) times daily as needed. 140 mL -Warren, Sadie Haber, NP      PDMP not reviewed this encounter.   Abran Cantor,  NP 08/04/23 1536

## 2023-08-04 NOTE — Discharge Instructions (Addendum)
The chest x-ray is negative for pneumonia. Take medication as prescribed. Increase fluids and allow for plenty of rest. Recommend using a humidifier in your bedroom at nighttime during sleep and sleeping elevated on pillows. Recommend using cough drops throughout the day and drinking plenty of fluids to help with your cough. If you are continuing to experience a cough, but are feeling well, continue increasing your fluid intake and use of over-the-counter cough drops or throat lozenges.  If you continue to experience a cough with new symptoms of fever, wheezing, shortness of breath, or difficulty breathing, please follow-up in this clinic or with your primary care physician for further evaluation. Follow-up as needed.

## 2023-08-04 NOTE — ED Triage Notes (Signed)
Productive Cough x 2 weeks with green sputum.   Was seen 2 weeks ago at an urgent care and was given antibiotics that did not help symptoms.  Tested negative for strep, covid and flu.

## 2023-08-15 DIAGNOSIS — M79644 Pain in right finger(s): Secondary | ICD-10-CM | POA: Insufficient documentation

## 2023-08-27 DIAGNOSIS — M20011 Mallet finger of right finger(s): Secondary | ICD-10-CM | POA: Insufficient documentation

## 2024-01-18 ENCOUNTER — Ambulatory Visit (INDEPENDENT_AMBULATORY_CARE_PROVIDER_SITE_OTHER): Payer: Self-pay | Admitting: Family Medicine

## 2024-01-18 ENCOUNTER — Encounter: Payer: Self-pay | Admitting: Family Medicine

## 2024-01-18 VITALS — BP 138/79 | HR 65 | Temp 97.7°F | Resp 14 | Ht 70.0 in | Wt 171.8 lb

## 2024-01-18 DIAGNOSIS — R3912 Poor urinary stream: Secondary | ICD-10-CM

## 2024-01-18 DIAGNOSIS — E538 Deficiency of other specified B group vitamins: Secondary | ICD-10-CM | POA: Diagnosis not present

## 2024-01-18 DIAGNOSIS — N1831 Chronic kidney disease, stage 3a: Secondary | ICD-10-CM

## 2024-01-18 DIAGNOSIS — N401 Enlarged prostate with lower urinary tract symptoms: Secondary | ICD-10-CM

## 2024-01-18 DIAGNOSIS — I25110 Atherosclerotic heart disease of native coronary artery with unstable angina pectoris: Secondary | ICD-10-CM

## 2024-01-18 DIAGNOSIS — Z1211 Encounter for screening for malignant neoplasm of colon: Secondary | ICD-10-CM | POA: Diagnosis not present

## 2024-01-18 MED ORDER — CYANOCOBALAMIN 1000 MCG/ML IJ SOLN: 1000.0000 ug | INTRAMUSCULAR | 0 refills | Status: AC

## 2024-01-18 MED ORDER — CYANOCOBALAMIN 1000 MCG/ML IJ SOLN
1000.0000 ug | Freq: Once | INTRAMUSCULAR | Status: AC
  Administered 2024-01-18: 1000 ug via INTRAMUSCULAR

## 2024-01-18 MED ORDER — CYANOCOBALAMIN 1000 MCG/ML IJ SOLN: 1000.0000 ug | INTRAMUSCULAR | 5 refills | Status: DC

## 2024-01-18 NOTE — Progress Notes (Signed)
   Established Patient Office Visit  Subjective   Patient ID: Marc Sutton, male    DOB: 03-02-1961  Age: 63 y.o. MRN: [id number]  Chief Complaint  Patient presents with   Establish Care   Medical Management of Chronic Issues    HPI  [name] went to the ED with chest pain 11/24 he had a treadmill test and everything was okay had a cardiac cath 12/23 showed 50% blockage of his circumflex at the OM branch.  He is now on atorvastatin 80 mg and aspirin.  114/25 total cholesterol 88, Triggs 83, HDL 30, LDL 41.  Has a follow-up with cardiology 1/27. He has CKD 3a has not seen a kidney specialist in several years.  01/10/2024 creatinine was 1.7. Last colonoscopy was 10 years ago.  Need to get that set up for him. Has a B12 deficiency.  Checked his B12 in the hospital and was 82.  He wants to give himself his B12 injections because it is too difficult to get to the office.     ROS    Objective:     BP 138/79 (BP Location: Left Arm, Patient Position: Sitting, Cuff Size: Normal)   Pulse 65   Temp 97.7 F (36.5 C) (Oral)   Resp 14   Ht 5\' 10"  (1.778 m) Comment: per patient  Wt 171 lb 12.8 oz (77.9 kg)   SpO2 97%   BMI 24.65 kg/m    Physical Exam Vitals and nursing note reviewed.  Constitutional:      Appearance: Normal appearance.  HENT:     Head: Normocephalic and atraumatic.  Eyes:     Conjunctiva/sclera: Conjunctivae normal.  Cardiovascular:     Rate and Rhythm: Normal rate and regular rhythm.  Pulmonary:     Effort: Pulmonary effort is normal.     Breath sounds: Normal breath sounds.  Musculoskeletal:     Right lower leg: No edema.     Left lower leg: No edema.  Skin:    General: Skin is warm and dry.  Neurological:     Mental Status: He is alert and oriented to person, place, and time.  Psychiatric:        Mood and Affect: Mood normal.        Behavior: Behavior normal.        Thought Content: Thought content normal.        Judgment: Judgment normal.           No results found for any visits on 01/18/24.    The ASCVD Risk score (Arnett DK, et al., 2019) failed to calculate for the following reasons:   The valid total cholesterol range is 130 to 320 mg/dL    Assessment & Plan:  Screening for colon cancer -     Ambulatory referral to Gastroenterology  B12 deficiency -     B12 and Folate Panel -     Cyanocobalamin  Coronary artery disease involving native coronary artery of native heart with unstable angina pectoris (HCC) -     CMP14+EGFR  Benign prostatic hyperplasia with weak urinary stream -     PSA  Chronic kidney disease, stage 3a (HCC) -     Ambulatory referral to Nephrology     Return in about 3 months (around 04/17/2024).    Alease Medina, MD

## 2024-01-19 ENCOUNTER — Encounter: Payer: Self-pay | Admitting: Family Medicine

## 2024-01-19 ENCOUNTER — Telehealth: Payer: Self-pay

## 2024-01-19 LAB — CMP14+EGFR
ALT: 34 [IU]/L (ref 0–44)
AST: 31 [IU]/L (ref 0–40)
Albumin: 4.4 g/dL (ref 3.9–4.9)
Alkaline Phosphatase: 90 [IU]/L (ref 44–121)
BUN/Creatinine Ratio: 14 (ref 10–24)
BUN: 22 mg/dL (ref 8–27)
Bilirubin Total: 0.6 mg/dL (ref 0.0–1.2)
CO2: 20 mmol/L (ref 20–29)
Calcium: 9.7 mg/dL (ref 8.6–10.2)
Chloride: 110 mmol/L — ABNORMAL HIGH (ref 96–106)
Creatinine, Ser: 1.54 mg/dL — ABNORMAL HIGH (ref 0.76–1.27)
Globulin, Total: 2.6 g/dL (ref 1.5–4.5)
Glucose: 96 mg/dL (ref 70–99)
Potassium: 4.8 mmol/L (ref 3.5–5.2)
Sodium: 144 mmol/L (ref 134–144)
Total Protein: 7 g/dL (ref 6.0–8.5)
eGFR: 50 mL/min/{1.73_m2} — ABNORMAL LOW (ref >59)

## 2024-01-19 LAB — B12 AND FOLATE PANEL
Folate: 9.1 ng/mL (ref >3.0)
Vitamin B-12: 378 pg/mL (ref 232–1245)

## 2024-01-19 LAB — PSA: Prostate Specific Ag, Serum: 3.3 ng/mL (ref 0.0–4.0)

## 2024-01-19 NOTE — Telephone Encounter (Signed)
Copied from CRM [id number]. Topic: Clinical - Prescription Issue >> Jan 19, 2024  2:50 PM Antony Haste wrote: Reason for CRM: This patient's wife is wanting to know which needles are needed for him. She states they attempted to obtain his Vitamin B12 at the pharmacy, and the pharmacist there recommended a larger/stronger syringe and she is unsure which syringe type this would be for his B12. She is requesting a callback if possible.  Callback #: [phone]

## 2024-01-19 NOTE — Telephone Encounter (Signed)
Patient's wife called to confirm what size needle to use to administer B12 injections at home. I informed spouse that 25x1 gauge needle is what is used in office to administer. All questions and concerns have been addressed.

## 2024-01-20 ENCOUNTER — Telehealth: Payer: Self-pay

## 2024-01-20 DIAGNOSIS — Z1211 Encounter for screening for malignant neoplasm of colon: Secondary | ICD-10-CM

## 2024-01-20 NOTE — Telephone Encounter (Signed)
Gastroenterology Pre-Procedure Review  Request Date: TBD Requesting Physician: Dr. Jodelle Gross  PATIENT REVIEW QUESTIONS: The patient responded to the following health history questions as indicated:    1. Are you having any GI issues? no 2. Do you have a personal history of Polyps? no 3. Do you have a family history of Colon Cancer or Polyps? yes (father colon polyps) 4. Diabetes Mellitus? no 5. Joint replacements in the past 12 months?no 6. Major health problems in the past 3 months?no 7. Any artificial heart valves, MVP, or defibrillator?no 8. Cardiac issues? 50% heart blockage reported by patient.  Will be seeing new cardiologist Dr. Judithe Modest on 01/27.  Cardiac clearance and blood thinner advice sent to the office.     MEDICATIONS & ALLERGIES:    Patient reports the following regarding taking any anticoagulation/antiplatelet therapy:   Plavix, Coumadin, Eliquis, Xarelto, Lovenox, Pradaxa, Brilinta, or Effient? no Aspirin? yes (325mg  daily)  Patient confirms/reports the following medications:  Current Outpatient Medications  Medication Sig Dispense Refill   aspirin 325 MG tablet Take 325 mg by mouth daily.     atorvastatin (LIPITOR) 80 MG tablet Take 80 mg by mouth daily.     cyanocobalamin (VITAMIN B12) 1000 MCG/ML injection Inject 1 mL (1,000 mcg total) into the muscle once a week for 3 doses. 3 mL 0   [START ON 02/18/2024] cyanocobalamin (VITAMIN B12) 1000 MCG/ML injection Inject 1 mL (1,000 mcg total) into the muscle every 30 (thirty) days. 1 mL 5   nitroGLYCERIN (NITROSTAT) 0.4 MG SL tablet PLEASE SEE ATTACHED FOR DETAILED DIRECTIONS     No current facility-administered medications for this visit.    Patient confirms/reports the following allergies:  Allergies  Allergen Reactions   Codeine Nausea And Vomiting    No orders of the defined types were placed in this encounter.   AUTHORIZATION INFORMATION Primary Insurance: 1D#: Group #:  Secondary Insurance: 1D#: Group  #:  SCHEDULE INFORMATION: Date: tbd Time: Location: [facility]

## 2024-01-26 NOTE — Telephone Encounter (Signed)
Contacted Dr. Perry Mount office to check on the status of patients cardiac clearance and blood thinner advice.    He rescheduled his office visit from 01/23/24 to 03/07/24.  I will follow up on his clearance after this date.  The referral will be closed until clearance can be granted.  Thanks,  [place], [place]

## 2024-03-01 ENCOUNTER — Other Ambulatory Visit: Payer: Self-pay | Admitting: Family Medicine

## 2024-03-01 DIAGNOSIS — E538 Deficiency of other specified B group vitamins: Secondary | ICD-10-CM

## 2024-03-01 NOTE — Telephone Encounter (Signed)
 Copied from CRM [id number]. Topic: Clinical - Medication Refill >> Mar 01, 2024  9:19 AM Higinio Roger wrote: Most Recent Primary Care Visit:   Medication: cyanocobalamin (VITAMIN B12) 1000 MCG/ML injection   Has the patient contacted their pharmacy? No (Agent: If no, request that the patient contact the pharmacy for the refill. If patient does not wish to contact the pharmacy document the reason why and proceed with request.) (Agent: If yes, when and what did the pharmacy advise?)  Is this the correct pharmacy for this prescription? Yes If no, delete pharmacy and type the correct one.  This is the patient's preferred pharmacy:   [id number] [place], [place] - [address] [place] [address] Phone: [phone] Fax: [phone]  Has the prescription been filled recently? Yes  Is the patient out of the medication? Yes  Has the patient been seen for an appointment in the last year OR does the patient have an upcoming appointment? Yes  Can we respond through MyChart? Yes  Agent: Please be advised that Rx refills may take up to 3 business days. We ask that you follow-up with your pharmacy.

## 2024-03-01 NOTE — Telephone Encounter (Signed)
 Copied from CRM [id number]. Topic: Clinical - Medication Refill >> Mar 01, 2024  9:07 AM Carlatta H wrote: Most Recent Primary Care Visit:   Medication:atorvastatin (LIPITOR) 80 MG tablet [[id number]]   Has the patient contacted their pharmacy? No (Agent: If no, request that the patient contact the pharmacy for the refill. If patient does not wish to contact the pharmacy document the reason why and proceed with request.) (Agent: If yes, when and what did the pharmacy advise?)  Is this the correct pharmacy for this prescription? Yes If no, delete pharmacy and type the correct one.  This is the patient's preferred pharmacy:  [id number] [place], [place] - [address] [place] [address] Phone: [phone] Fax: [phone]     Has the prescription been filled recently? No  Is the patient out of the medication? Yes  Has the patient been seen for an appointment in the last year OR does the patient have an upcoming appointment? Yes  Can we respond through MyChart? No  Agent: Please be advised that Rx refills may take up to 3 business days. We ask that you follow-up with your pharmacy.

## 2024-03-02 MED ORDER — CYANOCOBALAMIN 1000 MCG/ML IJ SOLN: 1000.0000 ug | INTRAMUSCULAR | 5 refills | Status: DC

## 2024-03-07 DIAGNOSIS — R072 Precordial pain: Secondary | ICD-10-CM | POA: Insufficient documentation

## 2024-03-14 NOTE — Telephone Encounter (Signed)
 Clearance granted from Dr. Judithe Modest however advice was not provided regarding Aspirin 325mg .  Will refax to address tomorrow in office.  Thanks,  [place], [place]

## 2024-03-19 NOTE — Telephone Encounter (Signed)
 Refaxed to Dr. Judithe Modest and left message with front office.  Thanks,  [place], [place]

## 2024-03-22 NOTE — Telephone Encounter (Signed)
 Cardiac clearance and blood thinner advice received from Dr. Judithe Modest on 03/20/24.  Voice message has been left for patient to call office to schedule.  Per Dr. Netta Neat advice patient will be advised the following, Stop Aspirin 325mg   5 days prior and restart 1 day after.  Fax will be placed in scan folder at front office.  Thanks,  [place], [place]

## 2024-04-03 ENCOUNTER — Other Ambulatory Visit: Payer: Self-pay

## 2024-04-03 DIAGNOSIS — Z1211 Encounter for screening for malignant neoplasm of colon: Secondary | ICD-10-CM

## 2024-04-03 MED ORDER — NA SULFATE-K SULFATE-MG SULF 17.5-3.13-1.6 GM/177ML PO SOLN: 1.0000 | Freq: Once | ORAL | 0 refills | Status: AC

## 2024-04-03 NOTE — Addendum Note (Signed)
 Addended by: Avie Arenas on: 04/03/2024 10:42 AM   Modules accepted: Orders

## 2024-04-03 NOTE — Telephone Encounter (Signed)
 Colonoscopy has been scheduled with Dr. Servando Snare at [facility] on 04/17/24 at [facility].  Pt has been reminded  the following: Per Dr. Netta Neat advice patient will be advised the following, Stop Aspirin 325mg   5 days prior and restart 1 day after.   He stated that Dr. Judithe Modest actually decreased Aspirin to 81mg  now.  Advised that he may continue the 81mg  and hold it the morning of his procedure.

## 2024-04-10 ENCOUNTER — Encounter: Payer: Self-pay | Admitting: Gastroenterology

## 2024-04-17 ENCOUNTER — Ambulatory Visit: Payer: Self-pay | Admitting: Family Medicine

## 2024-04-17 ENCOUNTER — Ambulatory Visit
Admission: RE | Admit: 2024-04-17 | Discharge: 2024-04-17 | Disposition: A | Discharge status: Home / Self Care | Attending: Gastroenterology | Admitting: Gastroenterology

## 2024-04-17 ENCOUNTER — Ambulatory Visit: Admitting: Anesthesiology

## 2024-04-17 ENCOUNTER — Encounter
Admission: RE | Disposition: A | Discharge status: Home / Self Care | Payer: Self-pay | Source: Home / Self Care | Attending: Gastroenterology

## 2024-04-17 ENCOUNTER — Encounter: Payer: Self-pay | Admitting: Gastroenterology

## 2024-04-17 DIAGNOSIS — K6389 Other specified diseases of intestine: Secondary | ICD-10-CM

## 2024-04-17 DIAGNOSIS — Z1211 Encounter for screening for malignant neoplasm of colon: Secondary | ICD-10-CM

## 2024-04-17 DIAGNOSIS — I2510 Atherosclerotic heart disease of native coronary artery without angina pectoris: Secondary | ICD-10-CM | POA: Insufficient documentation

## 2024-04-17 DIAGNOSIS — D122 Benign neoplasm of ascending colon: Secondary | ICD-10-CM | POA: Diagnosis not present

## 2024-04-17 DIAGNOSIS — I129 Hypertensive chronic kidney disease with stage 1 through stage 4 chronic kidney disease, or unspecified chronic kidney disease: Secondary | ICD-10-CM | POA: Insufficient documentation

## 2024-04-17 DIAGNOSIS — D123 Benign neoplasm of transverse colon: Secondary | ICD-10-CM | POA: Diagnosis not present

## 2024-04-17 DIAGNOSIS — N183 Chronic kidney disease, stage 3 unspecified: Secondary | ICD-10-CM | POA: Insufficient documentation

## 2024-04-17 DIAGNOSIS — K635 Polyp of colon: Secondary | ICD-10-CM

## 2024-04-17 HISTORY — DX: Atherosclerotic heart disease of native coronary artery without angina pectoris: I25.10

## 2024-04-17 HISTORY — PX: COLONOSCOPY: SHX5424

## 2024-04-17 SURGERY — COLONOSCOPY
Anesthesia: General

## 2024-04-17 MED ORDER — PROPOFOL 1000 MG/100ML IV EMUL
INTRAVENOUS | Status: AC
  Filled 2024-04-17: qty 100

## 2024-04-17 MED ORDER — PROPOFOL 500 MG/50ML IV EMUL
INTRAVENOUS | Status: DC | PRN
  Administered 2024-04-17: 125 ug/kg/min via INTRAVENOUS

## 2024-04-17 MED ORDER — PROPOFOL 10 MG/ML IV BOLUS
INTRAVENOUS | Status: AC
  Filled 2024-04-17: qty 20

## 2024-04-17 MED ORDER — LIDOCAINE HCL (CARDIAC) PF 100 MG/5ML IV SOSY
PREFILLED_SYRINGE | INTRAVENOUS | Status: DC | PRN
  Administered 2024-04-17: 40 mg via INTRAVENOUS

## 2024-04-17 MED ORDER — PROPOFOL 10 MG/ML IV BOLUS
INTRAVENOUS | Status: DC | PRN
  Administered 2024-04-17: 50 mg via INTRAVENOUS

## 2024-04-17 MED ORDER — GLYCOPYRROLATE 0.2 MG/ML IJ SOLN
INTRAMUSCULAR | Status: DC | PRN
  Administered 2024-04-17: .1 mg via INTRAVENOUS

## 2024-04-17 MED ORDER — SODIUM CHLORIDE 0.9 % IV SOLN
INTRAVENOUS | Status: DC
Start: 2024-04-17 — End: 2024-04-17

## 2024-04-17 NOTE — Transfer of Care (Signed)
 Immediate Anesthesia Transfer of Care Note  Patient: Marc Sutton  Procedure(s) Performed: COLONOSCOPY  Patient Location: PACU  Anesthesia Type:General  Level of Consciousness: drowsy  Airway & Oxygen Therapy: Patient Spontanous Breathing and Patient connected to nasal cannula oxygen  Post-op Assessment: Report given to RN and Post -op Vital signs reviewed and stable  Post vital signs: Reviewed and stable  Last Vitals:  Vitals Value Taken Time  BP 96/59 04/17/24 0849  Temp 36 C 04/17/24 0848  Pulse 61 04/17/24 0850  Resp 15 04/17/24 0850  SpO2 99 % 04/17/24 0850  Vitals shown include unfiled device data.  Last Pain:  Vitals:   04/17/24 0848  TempSrc: Rectal  PainSc: 0-No pain       Pt sleepy. Patent airway to PACU, breathing spontaneously on 4L O2 via [place]. VSS.  Complications: No notable events documented.

## 2024-04-17 NOTE — H&P (Signed)
 Marnee Sink, MD [facility] [address], [address] [place], [place] [address] Phone: [phone] Fax : [phone]  Primary Care Physician:  Ziglar, Susan K, MD Primary Gastroenterologist:  Dr. Ole Berkeley  Pre-Procedure History & Physical: HPI:  Marc Sutton is a 63 y.o. male is here for a screening colonoscopy.   Past Medical History:  Diagnosis Date   Coronary artery disease    Heart disease    Kidney disease     Past Surgical History:  Procedure Laterality Date   COLONOSCOPY     KNEE SURGERY  2006   left knee arthroscopy   SKIN CANCER EXCISION Left    arm   VASECTOMY  1993    Prior to Admission medications   Medication Sig Start Date End Date Taking? Authorizing Provider  aspirin 325 MG tablet Take 81 mg by mouth daily.   Yes [provider]  atorvastatin (LIPITOR) 80 MG tablet Take 80 mg by mouth daily.   Yes [provider]  cyanocobalamin  (VITAMIN B12) 1000 MCG/ML injection Inject 1 mL (1,000 mcg total) into the muscle every 30 (thirty) days. 03/02/24  Yes Ziglar, Susan K, MD  nitroGLYCERIN (NITROSTAT) 0.4 MG SL tablet PLEASE SEE ATTACHED FOR DETAILED DIRECTIONS    [provider]    Allergies as of 04/03/2024 - Review Complete 01/20/2024  Allergen Reaction Noted   Codeine Nausea And Vomiting 06/07/2011    Family History  Problem Relation Age of Onset   Hypertension Mother    Clotting disorder Mother        "body does not make blood" has to get transfusions   Heart disease Father     Social History   Socioeconomic History   Marital status: Married    Spouse name: Not on file   Number of children: Not on file   Years of education: Not on file   Highest education level: Not on file  Occupational History   Occupation: Oncologist  Tobacco Use   Smoking status: Never   Smokeless tobacco: Never  Vaping Use   Vaping status: Never Used  Substance and Sexual Activity   Alcohol use: Yes    Alcohol/week: 3.0 standard drinks of  alcohol    Types: 3 Standard drinks or equivalent per week    Comment: occasionally   Drug use: No   Sexual activity: Yes    Birth control/protection: None  Other Topics Concern   Not on file  Social History Narrative   L[place] in [place]; lies with wife; grown up son/daughter [lives close]; never smoked; no alcohol. Presently not working;  worked in [employer]   Social Drivers of Corporate investment banker Strain: Not on [employer] Insecurity: Not on file  Transportation Needs: Not on file  Physical Activity: Not on file  Stress: Not on file  Social Connections: Unknown (04/08/2023)   Received from [facility]   Social Network    Social Network: Not on file  Intimate Partner Violence: Unknown (04/08/2023)   [facility]   HITS    Physically Hurt: Not on file    Insult or Talk Down To: Not on file    Threaten Physical Harm: Not on file    Scream or Curse: Not on file    Review of Systems: See HPI, otherwise negative ROS  Physical Exam: BP (!) 143/87   Pulse (!) 59   Temp (!) 97 F (36.1 C)   Wt 71.9  kg   SpO2 100%   BMI 22.76 kg/m  General:   Alert,  pleasant and cooperative in NAD Head:  Normocephalic and atraumatic. Neck:  Supple; no masses or thyromegaly. Lungs:  Clear throughout to auscultation.    Heart:  Regular rate and rhythm. Abdomen:  Soft, nontender and nondistended. Normal bowel sounds, without guarding, and without rebound.   Neurologic:  Alert and  oriented x4;  grossly normal neurologically.  Impression/Plan: Marc Sutton is now here to undergo a screening colonoscopy.  Risks, benefits, and alternatives regarding colonoscopy have been reviewed with the patient.  Questions have been answered.  All parties agreeable.

## 2024-04-17 NOTE — Anesthesia Postprocedure Evaluation (Signed)
 Anesthesia Post Note  Patient: Marc Sutton  Procedure(s) Performed: COLONOSCOPY  Patient location during evaluation: PACU Anesthesia Type: General Level of consciousness: awake and alert, oriented and patient cooperative Pain management: pain level controlled Vital Signs Assessment: post-procedure vital signs reviewed and stable Respiratory status: spontaneous breathing, nonlabored ventilation and respiratory function stable Cardiovascular status: blood pressure returned to baseline and stable Postop Assessment: adequate PO intake Anesthetic complications: no   No notable events documented.   Last Vitals:  Vitals:   04/17/24 0908 04/17/24 0912  BP:  119/81  Pulse: (!) 53 (!) 51  Resp: 13 10  Temp:    SpO2: 100% 100%    Last Pain:  Vitals:   04/17/24 0912  TempSrc:   PainSc: 0-No pain                 Dorothey Gate

## 2024-04-17 NOTE — Op Note (Signed)
 [facility] Patient Name: Marc Sutton Procedure Date: 04/17/2024 8:26 AM MRN: [id number] Account #: [id number] Date of Birth: [birth year] Admit Type: Outpatient Age: 63 Room: [facility] ENDO ROOM 4 Gender: Male Note Status: Finalized Instrument Name: Charlyn Cooley [id number] Procedure:             Colonoscopy Indications:           Screening for colorectal malignant neoplasm Providers:             Marnee Sink MD, MD Referring MD:          Susan K. Ziglar (Referring MD) Medicines:             Propofol  per Anesthesia Complications:         No immediate complications. Procedure:             Pre-Anesthesia Assessment:                        - Prior to the procedure, a History and Physical was                         performed, and patient medications and allergies were                         reviewed. The patient's tolerance of previous                         anesthesia was also reviewed. The risks and benefits                         of the procedure and the sedation options and risks                         were discussed with the patient. All questions were                         answered, and informed consent was obtained. Prior                         Anticoagulants: The patient has taken no anticoagulant                         or antiplatelet agents. ASA Grade Assessment: II - A                         patient with mild systemic disease. After reviewing                         the risks and benefits, the patient was deemed in                         satisfactory condition to undergo the procedure.                        After obtaining informed consent, the colonoscope was                         passed under direct vision. Throughout the procedure,  the patient's blood pressure, pulse, and oxygen                         saturations were monitored continuously. The                         Colonoscope was introduced through the  anus and                         advanced to the the cecum, identified by appendiceal                         orifice and ileocecal valve. The colonoscopy was                         performed without difficulty. The patient tolerated                         the procedure well. The quality of the bowel                         preparation was excellent. Findings:      The perianal and digital rectal examinations were normal.      Three sessile polyps were found in the ascending colon. The polyps were       3 to 4 mm in size. These polyps were removed with a cold snare.       Resection and retrieval were complete.      A 4 mm polyp was found in the transverse colon. The polyp was sessile.       The polyp was removed with a cold snare. Resection and retrieval were       complete. Impression:            - Three 3 to 4 mm polyps in the ascending colon,                         removed with a cold snare. Resected and retrieved.                        - One 4 mm polyp in the transverse colon, removed with                         a cold snare. Resected and retrieved. Recommendation:        - Discharge patient to home.                        - Resume previous diet.                        - Continue present medications.                        - Await pathology results.                        - If the pathology report reveals adenomatous tissue,                         then repeat the colonoscopy for surveillance  in 5                         years. Procedure Code(s):     --- Professional ---                        [phone], Colonoscopy, flexible; with removal of                         tumor(s), polyp(s), or other lesion(s) by snare                         technique Diagnosis Code(s):     --- Professional ---                        Z12.11, Encounter for screening for malignant neoplasm                         of colon                        D12.2, Benign neoplasm of ascending colon CPT copyright  2022 American Medical Association. All rights reserved. The codes documented in this report are preliminary and upon coder review may  be revised to meet current compliance requirements. Marnee Sink MD, MD 04/17/2024 8:47:11 AM This report has been signed electronically. Number of Addenda: 0 Note Initiated On: 04/17/2024 8:26 AM Scope Withdrawal Time: 0 hours 9 minutes 24 seconds  Total Procedure Duration: 0 hours 12 minutes 12 seconds  Estimated Blood Loss:  Estimated blood loss: none.      [facility]

## 2024-04-17 NOTE — Anesthesia Preprocedure Evaluation (Addendum)
 Anesthesia Evaluation  Patient identified by MRN, date of birth, ID band Patient awake    Reviewed: Allergy & Precautions, NPO status , Patient's Chart, lab work & pertinent test results  History of Anesthesia Complications Negative for: history of anesthetic complications  Airway Mallampati: IV   Neck ROM: Full    Dental no notable dental hx.    Pulmonary neg pulmonary ROS   Pulmonary exam normal breath sounds clear to auscultation       Cardiovascular + CAD  Normal cardiovascular exam Rhythm:Regular Rate:Normal  Myocardial perfusion 11/18/23:  1. No reversible ischemia or infarction.  2. Grossly normal left ventricular wall motion. Relative decreased contractibility of the septum at the base of the heart.  3. Left ventricular ejection fraction 58%  4. Non invasive risk stratification*: Low   Cardiac cath 01/18/23:  - Mild-moderate coronary artery disease without apparent flow limiting lesions, there is a 50% LCx/OM stenosis  - Mid-LCx with ectasia and moderate plaque is notably FFR negative (Pd/Pa = 0.99).    Neuro/Psych negative neurological ROS     GI/Hepatic negative GI ROS,,,  Endo/Other  negative endocrine ROS    Renal/GU Renal disease (stage III CKD)     Musculoskeletal   Abdominal   Peds  Hematology negative hematology ROS (+)   Anesthesia Other Findings Cardiology note 03/07/24:  1. Precordial chest pain (Primary) - ECG 12 lead  2. CAD in native artery--nonobstructive CAD by cath 12/2022 [facility]  3. Hypercholesterolemia  Return in about 6 months (around 09/07/2024).  PLAN: Mr. Dipasquale seems to be doing fairly well. He still describes some chest discomfort. His focal pain in the upper left chest and seems somewhat atypical. His cardiac catheterization at [facility] in January 2024 showed nonobstructive disease with a 50% circumflex lesion and mild disease otherwise. A myocardial perfusion scan in  November 2024 showed no ischemia.  At this point I would recommend continued aggressive risk factor modification. His blood pressure seems to be well-controlled. His cholesterol checked last November showed an LDL of 41.  I have recommended he stay active.  I will see him back in 6 months and then we can decide if annual or semiannual visits are warranted.    Reproductive/Obstetrics                             Anesthesia Physical Anesthesia Plan  ASA: 2  Anesthesia Plan: General   Post-op Pain Management:    Induction: Intravenous  PONV Risk Score and Plan: 2 and Propofol  infusion, TIVA and Treatment may vary due to age or medical condition  Airway Management Planned: Natural Airway  Additional Equipment:   Intra-op Plan:   Post-operative Plan:   Informed Consent: I have reviewed the patients History and Physical, chart, labs and discussed the procedure including the risks, benefits and alternatives for the proposed anesthesia with the patient or authorized representative who has indicated his/her understanding and acceptance.       Plan Discussed with: CRNA  Anesthesia Plan Comments: (LMA/GETA backup discussed.  Patient consented for risks of anesthesia including but not limited to:  - adverse reactions to medications - damage to eyes, teeth, lips or other oral mucosa - nerve damage due to positioning  - sore throat or hoarseness - damage to heart, brain, nerves, lungs, other parts of body or loss of life  Informed patient about role of CRNA in peri- and intra-operative care.  Patient voiced understanding.)  Anesthesia Quick Evaluation

## 2024-04-18 ENCOUNTER — Encounter: Payer: Self-pay | Admitting: Gastroenterology

## 2024-04-18 LAB — SURGICAL PATHOLOGY

## 2024-04-24 ENCOUNTER — Encounter: Payer: Self-pay | Admitting: Family Medicine

## 2024-04-24 ENCOUNTER — Ambulatory Visit (INDEPENDENT_AMBULATORY_CARE_PROVIDER_SITE_OTHER): Payer: Self-pay | Admitting: Family Medicine

## 2024-04-24 VITALS — BP 130/80 | HR 62 | Temp 97.6°F | Resp 18 | Ht 70.0 in | Wt 167.0 lb

## 2024-04-24 DIAGNOSIS — D472 Monoclonal gammopathy: Secondary | ICD-10-CM | POA: Diagnosis not present

## 2024-04-24 DIAGNOSIS — N401 Enlarged prostate with lower urinary tract symptoms: Secondary | ICD-10-CM

## 2024-04-24 DIAGNOSIS — I25110 Atherosclerotic heart disease of native coronary artery with unstable angina pectoris: Secondary | ICD-10-CM

## 2024-04-24 DIAGNOSIS — E538 Deficiency of other specified B group vitamins: Secondary | ICD-10-CM

## 2024-04-24 DIAGNOSIS — R3912 Poor urinary stream: Secondary | ICD-10-CM

## 2024-04-24 DIAGNOSIS — E782 Mixed hyperlipidemia: Secondary | ICD-10-CM | POA: Diagnosis not present

## 2024-04-24 DIAGNOSIS — D229 Melanocytic nevi, unspecified: Secondary | ICD-10-CM

## 2024-04-24 DIAGNOSIS — E7800 Pure hypercholesterolemia, unspecified: Secondary | ICD-10-CM

## 2024-04-24 NOTE — Assessment & Plan Note (Signed)
 Has nocturia 1-2 times and a weaker stream, has to double void at times.  Does not feel he needs medications at this point

## 2024-04-24 NOTE — Assessment & Plan Note (Signed)
 LHC 11/2349% circumflex at OM.  On atorvastatin 80 mg and aspirin 81 mg.

## 2024-04-24 NOTE — Assessment & Plan Note (Signed)
 Center of his back he has a 3 mm round hyperpigmented nevus.  Advised we can take it off here but he prefers to go to dermatology.

## 2024-04-24 NOTE — Assessment & Plan Note (Signed)
 Goal for him is LDL less than 70.  On atorvastatin 80 mg daily.

## 2024-04-24 NOTE — Progress Notes (Signed)
 Established Patient Office Visit  Subjective   Patient ID: Marc Sutton, male    DOB: [birth year]  Age: 63 y.o. MRN: [id number]  Chief Complaint  Patient presents with   Medical Management of Chronic Issues    HPI Delightful 63 year old gentleman with B12 deficiency (wife gives him a shot monthly), BPH, CKD 3A, CAD (LHC 11/2349% circumflex at OM), ED, mixed hyperlipidemia, MGUS. He is feeling well, not having any difficulties.   Just saw cardiology and got a good report.  Denies chest pain with exertion.  No changes in meds.  Has nocturia 1-2 times and a weak stream.  Occasionally has to double void.  Symptoms are not significant enough and he declines treatment at this time Has not seen the oncologist about his MGUS this year.   Normally goes to dermatology yearly and gets a mole check.  He is off schedule and has not been to Derm in a couple years. Got a colonoscopy for 65-65 for tubular adenomas.  5-year follow-up    ROS    Objective:     BP 130/80 (BP Location: Left Arm, Patient Position: Sitting)   Pulse 62   Temp 97.6 F (36.4 C) (Oral)   Resp 18   Ht 5\' 10"  (1.778 m)   Wt 167 lb (75.8 kg)   SpO2 97%   BMI 23.96 kg/m    Physical Exam Vitals and nursing note reviewed.  Constitutional:      Appearance: Normal appearance.  HENT:     Head: Normocephalic and atraumatic.  Eyes:     Conjunctiva/sclera: Conjunctivae normal.  Cardiovascular:     Rate and Rhythm: Normal rate and regular rhythm.  Pulmonary:     Effort: Pulmonary effort is normal.     Breath sounds: Normal breath sounds.  Musculoskeletal:     Right lower leg: No edema.     Left lower leg: No edema.  Skin:    General: Skin is warm and dry.     Comments: Has a small, 3 mm round black pigmented mole in the center of his back.  Neurological:     Mental Status: He is alert and oriented to person, place, and time.  Psychiatric:        Mood and Affect: Mood normal.        Behavior: Behavior  normal.        Thought Content: Thought content normal.        Judgment: Judgment normal.          No results found for any visits on 04/24/24.    The ASCVD Risk score (Arnett DK, et al., 2019) failed to calculate for the following reasons:   The valid total cholesterol range is 130 to 320 mg/dL    Assessment & Plan:  Mixed hyperlipidemia -     Comprehensive metabolic panel with GFR; Future -     Lipid panel; Future  MGUS (monoclonal gammopathy of unknown significance) Assessment & Plan: Has not followed up with oncology in a couple years.  Will refer.  Orders: -     CBC with Differential/Platelet; Future  Vitamin B12 deficiency -     Vitamin B12; Future  Benign prostatic hyperplasia with weak urinary stream Assessment & Plan: Has nocturia 1-2 times and a weaker stream, has to double void at times.  Does not feel he needs medications at this point   Coronary artery disease involving native coronary artery of native heart with unstable angina pectoris [facility]) Assessment & Plan:  LHC 11/2349% circumflex at OM.  On atorvastatin 80 mg and aspirin 81 mg.   Atypical nevus Assessment & Plan: Center of his back he has a 3 mm round hyperpigmented nevus.  Advised we can take it off here but he prefers to go to dermatology.   Hypercholesterolemia Assessment & Plan: Goal for him is LDL less than 70.  On atorvastatin 80 mg daily.      Return in about 6 months (around 10/24/2024).    Emmanuell Kantz K Rontavious Albright, MD

## 2024-04-24 NOTE — Assessment & Plan Note (Signed)
 Has not followed up with oncology in a couple years.  Will refer.

## 2024-04-25 ENCOUNTER — Ambulatory Visit

## 2024-04-25 DIAGNOSIS — D472 Monoclonal gammopathy: Secondary | ICD-10-CM

## 2024-04-25 DIAGNOSIS — E782 Mixed hyperlipidemia: Secondary | ICD-10-CM

## 2024-04-25 DIAGNOSIS — E538 Deficiency of other specified B group vitamins: Secondary | ICD-10-CM

## 2024-04-26 ENCOUNTER — Encounter: Payer: Self-pay | Admitting: Family Medicine

## 2024-04-26 LAB — COMPREHENSIVE METABOLIC PANEL WITH GFR
ALT: 30 IU/L (ref 0–44)
AST: 26 IU/L (ref 0–40)
Albumin: 4.4 g/dL (ref 3.9–4.9)
Alkaline Phosphatase: 81 IU/L (ref 44–121)
BUN/Creatinine Ratio: 14 (ref 10–24)
BUN: 22 mg/dL (ref 8–27)
Bilirubin Total: 0.7 mg/dL (ref 0.0–1.2)
CO2: 23 mmol/L (ref 20–29)
Calcium: 9.4 mg/dL (ref 8.6–10.2)
Chloride: 105 mmol/L (ref 96–106)
Creatinine, Ser: 1.57 mg/dL — ABNORMAL HIGH (ref 0.76–1.27)
Globulin, Total: 2.4 g/dL (ref 1.5–4.5)
Glucose: 89 mg/dL (ref 70–99)
Potassium: 4.6 mmol/L (ref 3.5–5.2)
Sodium: 142 mmol/L (ref 134–144)
Total Protein: 6.8 g/dL (ref 6.0–8.5)
eGFR: 49 mL/min/{1.73_m2} — ABNORMAL LOW (ref >59)

## 2024-04-26 LAB — CBC WITH DIFFERENTIAL/PLATELET
Basophils Absolute: 0 10*3/uL (ref 0.0–0.2)
Basos: 1 %
EOS (ABSOLUTE): 0.2 10*3/uL (ref 0.0–0.4)
Eos: 4 %
Hematocrit: 39 % (ref 37.5–51.0)
Hemoglobin: 12.5 g/dL — ABNORMAL LOW (ref 13.0–17.7)
Immature Grans (Abs): 0 10*3/uL (ref 0.0–0.1)
Immature Granulocytes: 0 %
Lymphocytes Absolute: 1.2 10*3/uL (ref 0.7–3.1)
Lymphs: 22 %
MCH: 29.7 pg (ref 26.6–33.0)
MCHC: 32.1 g/dL (ref 31.5–35.7)
MCV: 93 fL (ref 79–97)
Monocytes Absolute: 0.6 10*3/uL (ref 0.1–0.9)
Monocytes: 11 %
Neutrophils Absolute: 3.5 10*3/uL (ref 1.4–7.0)
Neutrophils: 62 %
Platelets: 208 10*3/uL (ref 150–450)
RBC: 4.21 x10E6/uL (ref 4.14–5.80)
RDW: 13.1 % (ref 11.6–15.4)
WBC: 5.6 10*3/uL (ref 3.4–10.8)

## 2024-04-26 LAB — LIPID PANEL
Chol/HDL Ratio: 2.8 ratio (ref 0.0–5.0)
Cholesterol, Total: 125 mg/dL (ref 100–199)
HDL: 44 mg/dL (ref >39)
LDL Chol Calc (NIH): 67 mg/dL (ref 0–99)
Triglycerides: 66 mg/dL (ref 0–149)
VLDL Cholesterol Cal: 14 mg/dL (ref 5–40)

## 2024-04-26 LAB — VITAMIN B12: Vitamin B-12: 322 pg/mL (ref 232–1245)

## 2024-04-27 ENCOUNTER — Other Ambulatory Visit: Payer: Self-pay | Admitting: *Deleted

## 2024-04-27 DIAGNOSIS — Z8042 Family history of malignant neoplasm of prostate: Secondary | ICD-10-CM

## 2024-04-27 DIAGNOSIS — N401 Enlarged prostate with lower urinary tract symptoms: Secondary | ICD-10-CM

## 2024-04-30 ENCOUNTER — Inpatient Hospital Stay: Attending: Internal Medicine

## 2024-04-30 DIAGNOSIS — E538 Deficiency of other specified B group vitamins: Secondary | ICD-10-CM | POA: Insufficient documentation

## 2024-04-30 DIAGNOSIS — D472 Monoclonal gammopathy: Secondary | ICD-10-CM | POA: Insufficient documentation

## 2024-04-30 DIAGNOSIS — N183 Chronic kidney disease, stage 3 unspecified: Secondary | ICD-10-CM | POA: Insufficient documentation

## 2024-04-30 DIAGNOSIS — D649 Anemia, unspecified: Secondary | ICD-10-CM | POA: Diagnosis not present

## 2024-04-30 DIAGNOSIS — M549 Dorsalgia, unspecified: Secondary | ICD-10-CM | POA: Diagnosis not present

## 2024-04-30 DIAGNOSIS — N401 Enlarged prostate with lower urinary tract symptoms: Secondary | ICD-10-CM

## 2024-04-30 DIAGNOSIS — Z8042 Family history of malignant neoplasm of prostate: Secondary | ICD-10-CM

## 2024-04-30 LAB — CMP (CANCER CENTER ONLY)
ALT: 29 U/L (ref 0–44)
AST: 25 U/L (ref 15–41)
Albumin: 4.1 g/dL (ref 3.5–5.0)
Alkaline Phosphatase: 72 U/L (ref 38–126)
Anion gap: 8 (ref 5–15)
BUN: 25 mg/dL — ABNORMAL HIGH (ref 8–23)
CO2: 24 mmol/L (ref 22–32)
Calcium: 8.8 mg/dL — ABNORMAL LOW (ref 8.9–10.3)
Chloride: 107 mmol/L (ref 98–111)
Creatinine: 1.66 mg/dL — ABNORMAL HIGH (ref 0.61–1.24)
GFR, Estimated: 46 mL/min — ABNORMAL LOW (ref >60)
Glucose, Bld: 102 mg/dL — ABNORMAL HIGH (ref 70–99)
Potassium: 4.3 mmol/L (ref 3.5–5.1)
Sodium: 139 mmol/L (ref 135–145)
Total Bilirubin: 0.9 mg/dL (ref 0.0–1.2)
Total Protein: 7.6 g/dL (ref 6.5–8.1)

## 2024-04-30 LAB — CBC WITH DIFFERENTIAL (CANCER CENTER ONLY)
Abs Immature Granulocytes: 0.02 10*3/uL (ref 0.00–0.07)
Basophils Absolute: 0 10*3/uL (ref 0.0–0.1)
Basophils Relative: 1 %
Eosinophils Absolute: 0.2 10*3/uL (ref 0.0–0.5)
Eosinophils Relative: 4 %
HCT: 37.2 % — ABNORMAL LOW (ref 39.0–52.0)
Hemoglobin: 12.2 g/dL — ABNORMAL LOW (ref 13.0–17.0)
Immature Granulocytes: 0 %
Lymphocytes Relative: 24 %
Lymphs Abs: 1.2 10*3/uL (ref 0.7–4.0)
MCH: 29.8 pg (ref 26.0–34.0)
MCHC: 32.8 g/dL (ref 30.0–36.0)
MCV: 91 fL (ref 80.0–100.0)
Monocytes Absolute: 0.6 10*3/uL (ref 0.1–1.0)
Monocytes Relative: 11 %
Neutro Abs: 3.1 10*3/uL (ref 1.7–7.7)
Neutrophils Relative %: 60 %
Platelet Count: 208 10*3/uL (ref 150–400)
RBC: 4.09 MIL/uL — ABNORMAL LOW (ref 4.22–5.81)
RDW: 12.3 % (ref 11.5–15.5)
WBC Count: 5.1 10*3/uL (ref 4.0–10.5)
nRBC: 0 % (ref 0.0–0.2)

## 2024-05-01 LAB — KAPPA/LAMBDA LIGHT CHAINS
Kappa free light chain: 35.2 mg/L — ABNORMAL HIGH (ref 3.3–19.4)
Kappa, lambda light chain ratio: 2.1 — ABNORMAL HIGH (ref 0.26–1.65)
Lambda free light chains: 16.8 mg/L (ref 5.7–26.3)

## 2024-05-02 ENCOUNTER — Telehealth: Payer: Self-pay | Admitting: Family Medicine

## 2024-05-02 LAB — MULTIPLE MYELOMA PANEL, SERUM
Albumin SerPl Elph-Mcnc: 4 g/dL (ref 2.9–4.4)
Albumin/Glob SerPl: 1.4 (ref 0.7–1.7)
Alpha 1: 0.2 g/dL (ref 0.0–0.4)
Alpha2 Glob SerPl Elph-Mcnc: 0.7 g/dL (ref 0.4–1.0)
B-Globulin SerPl Elph-Mcnc: 0.8 g/dL (ref 0.7–1.3)
Gamma Glob SerPl Elph-Mcnc: 1.2 g/dL (ref 0.4–1.8)
Globulin, Total: 3 g/dL (ref 2.2–3.9)
IgA: 146 mg/dL (ref 61–437)
IgG (Immunoglobin G), Serum: 1333 mg/dL (ref 603–1613)
IgM (Immunoglobulin M), Srm: 67 mg/dL (ref 20–172)
M Protein SerPl Elph-Mcnc: 0.5 g/dL — ABNORMAL HIGH
Total Protein ELP: 7 g/dL (ref 6.0–8.5)

## 2024-05-02 NOTE — Telephone Encounter (Signed)
 Copied from CRM [id number]. Topic: Referral - Question >> May 02, 2024  2:42 PM Monta Anton I wrote: Resent referral no other followup needed  Resent referral to [facility]. Accepting new patients

## 2024-05-08 ENCOUNTER — Encounter: Payer: Self-pay | Admitting: Internal Medicine

## 2024-05-08 ENCOUNTER — Inpatient Hospital Stay: Admitting: Internal Medicine

## 2024-05-08 VITALS — BP 129/81 | HR 60 | Temp 97.8°F | Resp 12 | Ht 70.0 in | Wt 167.6 lb

## 2024-05-08 DIAGNOSIS — D472 Monoclonal gammopathy: Secondary | ICD-10-CM

## 2024-05-08 NOTE — Progress Notes (Signed)
 C/o right hand pain x3 days, no injury, 4/10.  Saw dermatology, Dr. Larina Plough, froze spot on nose and place taken off right arm.

## 2024-05-08 NOTE — Patient Instructions (Addendum)
#  Recommend gentle iron [iron biglycinate; 28 mg ] 1 pill a day.  This pill is unlikely to cause stomach upset or cause constipation.   # follow up Dr.Voora- Kidney doctor -Address: [address], [place], [place] [address] Phone: [phone]

## 2024-05-08 NOTE — Assessment & Plan Note (Addendum)
#    IgGK- 0.5 gm/dl; K/L= 1.61 [SEP 0960]- MGUS.-Not symptomatic myeloma-chemistries normal except for GFR 47-51/stage III kidney disease-see below.  MAY 2025- M protein- 0.5 -kappa lambda light chain ratio- 2.1 [K light chain= 35] stable.  However slightly worsened-anemia/renal function see below  I again had a long discussion with the patient regarding MGUS-long discussion with the patient regarding natural history of MGUS. small risk of progression to multiple myeloma. However, at this time patient is clinically asymptomatic-except for mild anemia/CKD-see below.  # Mild anemia- -I suspect patient's mild anemia secondary to iron deficiency CKD rather than progression of multiple myeloma. I recommend PO iron. #Recommend gentle iron [iron biglycinate; 28 mg ] 1 pill a day.  This pill is unlikely to cause stomach upset or cause constipation.   # Chronic kidney disease-stage-III [GFR -46];-unclear etiology Dr.Voora  Do not suspect monoclonal disease to be causing patient's abnormal GFR at this time.  Reviewed with the patient to avoid NSAIDs.  However if significantly worse renal function consider kidney biopsy.  Recommend follow up Dr.Voora- Kidney doctor.  Phone number given for the patient to call make an appointment.  # B12 deficiency [per pt]- B12 monthly-   # DISPOSITION:  # follow up in 6 months- MD; 2 week prior- labs- cbc/cmp;iron studies; ferritin; b12 levels;  MM panel; K/L light chains-  Dr.B  Cc; Dr.Voora; Dr.Zeiglar

## 2024-05-08 NOTE — Progress Notes (Signed)
 [facility] CONSULT NOTE  Patient Care Team: Ziglar, Susan K, MD as PCP - General (Family Medicine) Gwyn Leos, MD as Consulting Physician (Oncology)  CHIEF COMPLAINTS/PURPOSE OF CONSULTATION: Monoclonal gammopathy  HEMATOLOGY HISTORY  # MGUS-MONOCLONAL GAMMOPATHY- AUG 2022- M protein- 0.5gm/dl [Dr.Lateef] incidental  2020-2021- CKD - III [[place] [employer]]; IgG kappa 0.5 g; kappa lambda light chain 2.65.  # CKD stage-III [Dr.Lateef; Back pain work injury- on PT; COVID [NOV 2021] ; 2018- peyronies s/p surgery.   HISTORY OF PRESENTING ILLNESS: Ambulating independently. Alone.   Marc Sutton 63 y.o.  male is here today with results of his blood work ordered for monoclonal gammopathy.  Patient was last seen in 2023-in the clinic for monoclonal gammopathy.  However because of insurance reasons patient followed up with [facility]-myeloma clinic.  However, given resolution of insurance issues he is here to follow-up with us .  In the interim patient had skin cancer taken off his right arm.  Chronic intermittent back pain not any worse.  He also noted to have pain in his right hand in the last 3 years.  Otherwise fairly active.  Review of Systems  Constitutional:  Negative for chills, diaphoresis, fever and weight loss.  HENT:  Negative for nosebleeds and sore throat.   Eyes:  Negative for double vision.  Respiratory:  Negative for cough, hemoptysis, sputum production, shortness of breath and wheezing.   Cardiovascular:  Negative for chest pain, palpitations, orthopnea and leg swelling.  Gastrointestinal:  Negative for abdominal pain, blood in stool, constipation, diarrhea, heartburn, melena, nausea and vomiting.  Genitourinary:  Negative for dysuria, frequency and urgency.  Musculoskeletal:  Positive for back pain and joint pain.  Skin: Negative.  Negative for itching and rash.  Neurological:  Negative for dizziness, tingling, focal weakness, weakness and headaches.   Endo/Heme/Allergies:  Does not bruise/bleed easily.  Psychiatric/Behavioral:  Negative for depression. The patient is not nervous/anxious and does not have insomnia.     MEDICAL HISTORY:  Past Medical History:  Diagnosis Date   Coronary artery disease    Heart disease    Kidney disease     SURGICAL HISTORY: Past Surgical History:  Procedure Laterality Date   COLONOSCOPY     COLONOSCOPY N/A 04/17/2024   Procedure: COLONOSCOPY;  Surgeon: Marnee Sink, MD;  Location: [facility] ENDOSCOPY;  Service: Endoscopy;  Laterality: N/A;   KNEE SURGERY  2006   left knee arthroscopy   SKIN CANCER EXCISION Left    arm   VASECTOMY  1993    SOCIAL HISTORY: Social History   Socioeconomic History   Marital status: Married    Spouse name: Not on file   Number of children: Not on file   Years of education: Not on file   Highest education level: Not on file  Occupational History   Occupation: Oncologist  Tobacco Use   Smoking status: Never    Passive exposure: Never   Smokeless tobacco: Never  Vaping Use   Vaping status: Never Used  Substance and Sexual Activity   Alcohol use: Yes    Alcohol/week: 3.0 standard drinks of alcohol    Types: 3 Standard drinks or equivalent per week    Comment: occasionally   Drug use: No   Sexual activity: Yes    Birth control/protection: None  Other Topics Concern   Not on file  Social History Narrative   [place]; lies with wife; grown up son/daughter [lives close]; never smoked; no alcohol. Presently not working;  worked in [employer]   Social Drivers of Corporate investment banker Strain: Not on [employer] Insecurity: Not on file  Transportation Needs: Not on file  Physical Activity: Not on file  Stress: Not on file  Social Connections: Unknown (04/08/2023)   Received from [facility]   Social Network    Social Network: Not on file  Intimate Partner Violence: Unknown (04/08/2023)   Received from  [facility], [facility]   HITS    Physically Hurt: Not on file    Insult or Talk Down To: Not on file    Threaten Physical Harm: Not on file    Scream or Curse: Not on file    FAMILY HISTORY: Family History  Problem Relation Age of Onset   Hypertension Mother    Clotting disorder Mother        "body does not make blood" has to get transfusions   Heart disease Father     ALLERGIES:  is allergic to codeine.  MEDICATIONS:  Current Outpatient Medications  Medication Sig Dispense Refill   aspirin EC 81 MG tablet Take 81 mg by mouth daily.     atorvastatin (LIPITOR) 80 MG tablet Take 80 mg by mouth daily.     cyanocobalamin  (VITAMIN B12) 1000 MCG/ML injection Inject 1 mL (1,000 mcg total) into the muscle every 30 (thirty) days. 1 mL 5   nitroGLYCERIN (NITROSTAT) 0.4 MG SL tablet PLEASE SEE ATTACHED FOR DETAILED DIRECTIONS     No current facility-administered medications for this visit.      PHYSICAL EXAMINATION:   Vitals:   05/08/24 1004  BP: 129/81  Pulse: 60  Resp: 12  Temp: 97.8 F (36.6 C)  SpO2: 100%   Filed Weights   05/08/24 1004  Weight: 167 lb 9.6 oz (76 kg)    Physical Exam Vitals and nursing note reviewed.  HENT:     Head: Normocephalic and atraumatic.     Mouth/Throat:     Pharynx: Oropharynx is clear.  Eyes:     Extraocular Movements: Extraocular movements intact.     Pupils: Pupils are equal, round, and reactive to light.  Cardiovascular:     Rate and Rhythm: Normal rate and regular rhythm.  Pulmonary:     Comments: Decreased breath sounds bilaterally.  Abdominal:     Palpations: Abdomen is soft.  Musculoskeletal:        General: Normal range of motion.     Cervical back: Normal range of motion.  Skin:    General: Skin is warm.  Neurological:     General: No focal deficit present.     Mental Status: He is alert and oriented to person, place, and time.  Psychiatric:        Behavior: Behavior normal.        Judgment: Judgment  normal.    LABORATORY DATA:  I have reviewed the data as listed Lab Results  Component Value Date   WBC 5.1 04/30/2024   HGB 12.2 (L) 04/30/2024   HCT 37.2 (L) 04/30/2024   MCV 91.0 04/30/2024   PLT 208 04/30/2024   Recent Labs    01/18/24 0856 04/25/24 0810 04/30/24 0804  NA 144 142 139  K 4.8 4.6 4.3  CL 110* 105 107  CO2 20 23 24   GLUCOSE 96 89 102*  BUN 22 22 25*  CREATININE 1.54* 1.57* 1.66*  CALCIUM 9.7 9.4 8.8*  GFRNONAA  --   --  46*  PROT 7.0  6.8 7.6  ALBUMIN 4.4 4.4 4.1  AST 31 26 25   ALT 34 30 29  ALKPHOS 90 81 72  BILITOT 0.6 0.7 0.9     No results found.  Monoclonal gammopathy #  IgGK- 0.5 gm/dl; K/L= 1.61 [SEP 0960]- MGUS.-Not symptomatic myeloma-chemistries normal except for GFR 47-51/stage III kidney disease-see below.  MAY 2025- M protein- 0.5 -kappa lambda light chain ratio- 2.1 [K light chain= 35] stable.  However slightly worsened-anemia/renal function see below  I again had a long discussion with the patient regarding MGUS-long discussion with the patient regarding natural history of MGUS. small risk of progression to multiple myeloma. However, at this time patient is clinically asymptomatic-except for mild anemia/CKD-see below.  # Mild anemia- -I suspect patient's mild anemia secondary to iron deficiency CKD rather than progression of multiple myeloma. I recommend PO iron. #Recommend gentle iron [iron biglycinate; 28 mg ] 1 pill a day.  This pill is unlikely to cause stomach upset or cause constipation.   # Chronic kidney disease-stage-III [GFR -46];-unclear etiology Dr.Voora  Do not suspect monoclonal disease to be causing patient's abnormal GFR at this time.  Reviewed with the patient to avoid NSAIDs.  However if significantly worse renal function consider kidney biopsy.  Recommend follow up Dr.Voora- Kidney doctor.  Phone number given for the patient to call make an appointment.  # B12 deficiency [per pt]- B12 monthly-   # DISPOSITION:  #  follow up in 6 months- MD; 2 week prior- labs- cbc/cmp;iron studies; ferritin; b12 levels;  MM panel; K/L light chains-  Dr.B  Cc; Dr.Voora; Dr.Zeiglar   All questions were answered. The patient knows to call the clinic with any problems, questions or concerns.    Gwyn Leos, MD 05/08/2024 12:19 PM

## 2024-09-28 ENCOUNTER — Other Ambulatory Visit: Payer: Self-pay | Admitting: Family Medicine

## 2024-09-28 DIAGNOSIS — E538 Deficiency of other specified B group vitamins: Secondary | ICD-10-CM

## 2024-09-28 MED ORDER — CYANOCOBALAMIN 1000 MCG/ML IJ SOLN: 1000.0000 ug | INTRAMUSCULAR | 5 refills | Status: AC

## 2024-09-28 NOTE — Telephone Encounter (Signed)
 Copied from CRM [id number]. Topic: Clinical - Medication Refill >> Sep 28, 2024  9:43 AM Winona R wrote: Medication: cyanocobalamin  (VITAMIN B12) 1000 MCG/ML injection [[id number]]  Has the patient contacted their pharmacy? Yes, pharmacy informed pt he needs to call office for another refill  (Agent: If no, request that the patient contact the pharmacy for the refill. If patient does not wish to contact the pharmacy document the reason why and proceed with request.) (Agent: If yes, when and what did the pharmacy advise?)  This is the patient's preferred pharmacy:  [id number] [place], [place] - [address] [place] [address] Phone: [phone] Fax: [phone]   Is this the correct pharmacy for this prescription? Yes If no, delete pharmacy and type the correct one.   Has the prescription been filled recently? No  Is the patient out of the medication? Yes  Has the patient been seen for an appointment in the last year OR does the patient have an upcoming appointment? Yes  Can we respond through MyChart? No, text please   Agent: Please be advised that Rx refills may take up to 3 business days. We ask that you follow-up with your pharmacy.

## 2024-10-24 ENCOUNTER — Ambulatory Visit (INDEPENDENT_AMBULATORY_CARE_PROVIDER_SITE_OTHER): Admitting: Family Medicine

## 2024-10-24 ENCOUNTER — Encounter: Payer: Self-pay | Admitting: Family Medicine

## 2024-10-24 VITALS — BP 158/77 | HR 60 | Temp 98.4°F | Resp 18 | Ht 70.0 in | Wt 171.0 lb

## 2024-10-24 DIAGNOSIS — R030 Elevated blood-pressure reading, without diagnosis of hypertension: Secondary | ICD-10-CM | POA: Insufficient documentation

## 2024-10-24 DIAGNOSIS — Z8042 Family history of malignant neoplasm of prostate: Secondary | ICD-10-CM

## 2024-10-24 DIAGNOSIS — N401 Enlarged prostate with lower urinary tract symptoms: Secondary | ICD-10-CM

## 2024-10-24 DIAGNOSIS — R3912 Poor urinary stream: Secondary | ICD-10-CM

## 2024-10-24 DIAGNOSIS — E611 Iron deficiency: Secondary | ICD-10-CM | POA: Diagnosis not present

## 2024-10-24 DIAGNOSIS — E538 Deficiency of other specified B group vitamins: Secondary | ICD-10-CM | POA: Diagnosis not present

## 2024-10-24 DIAGNOSIS — D472 Monoclonal gammopathy: Secondary | ICD-10-CM

## 2024-10-24 DIAGNOSIS — Z125 Encounter for screening for malignant neoplasm of prostate: Secondary | ICD-10-CM

## 2024-10-24 DIAGNOSIS — D229 Melanocytic nevi, unspecified: Secondary | ICD-10-CM

## 2024-10-24 DIAGNOSIS — M25512 Pain in left shoulder: Secondary | ICD-10-CM

## 2024-10-24 MED ORDER — TAMSULOSIN HCL 0.4 MG PO CAPS: 0.4000 mg | ORAL_CAPSULE | Freq: Every day | ORAL | 3 refills | Status: AC

## 2024-10-24 NOTE — Assessment & Plan Note (Signed)
 Has an elevated blood pressure reading today.  This is unusual for him.  Ask him to get an above the elbow fully automatic blood pressure cuff and check his blood pressure at home.  Went over the details of how to check the blood pressure.  Follow-up in a month and bring those readings and your blood pressure machine.

## 2024-10-24 NOTE — Assessment & Plan Note (Signed)
 He has LUTS and it interferes with his sleep.  Try Flomax 0.4 mg take it at bedtime.  2 side effects are significant for this medication: Dizziness and retrograde ejaculation.  The retrograde ejaculation is fairly rare but dizziness is common.

## 2024-10-24 NOTE — Assessment & Plan Note (Signed)
 Has recently seen oncology and they opted for no treatment of his MGUS.  He is stable at this time.

## 2024-10-24 NOTE — Assessment & Plan Note (Signed)
 Has pain in his left shoulder.  Appears to be rotator cuff.  Offered him referral to physical therapy or orthopedist and he denied both.  Keep your range of motion so that your shoulder does not freeze.  If you change your mind about referral to us  let me know.

## 2024-10-24 NOTE — Assessment & Plan Note (Signed)
 Checking his PSA today.

## 2024-10-24 NOTE — Assessment & Plan Note (Signed)
 He gets a vitamin B12 injection at home monthly.  Checking his B12 levels today.

## 2024-10-24 NOTE — Assessment & Plan Note (Signed)
 He has seen dermatology and had a lesion removed from his right forearm.  The lesion on his back was considered normal.  He did get a total body check.
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
## 2024-10-25 LAB — CBC WITH DIFFERENTIAL/PLATELET
Basophils Absolute: 0.1 x10E3/uL (ref 0.0–0.2)
Basos: 1 %
EOS (ABSOLUTE): 0.3 x10E3/uL (ref 0.0–0.4)
Eos: 5 %
Hematocrit: 38.3 % (ref 37.5–51.0)
Hemoglobin: 12.7 g/dL — ABNORMAL LOW (ref 13.0–17.7)
Immature Grans (Abs): 0 x10E3/uL (ref 0.0–0.1)
Immature Granulocytes: 0 %
Lymphocytes Absolute: 1.1 x10E3/uL (ref 0.7–3.1)
Lymphs: 21 %
MCH: 30.7 pg (ref 26.6–33.0)
MCHC: 33.2 g/dL (ref 31.5–35.7)
MCV: 93 fL (ref 79–97)
Monocytes Absolute: 0.5 x10E3/uL (ref 0.1–0.9)
Monocytes: 10 %
Neutrophils Absolute: 3.4 x10E3/uL (ref 1.4–7.0)
Neutrophils: 63 %
Platelets: 237 x10E3/uL (ref 150–450)
RBC: 4.14 x10E6/uL (ref 4.14–5.80)
RDW: 12.8 % (ref 11.6–15.4)
WBC: 5.4 x10E3/uL (ref 3.4–10.8)

## 2024-10-25 LAB — IRON,TIBC AND FERRITIN PANEL
Ferritin: 193 ng/mL (ref 30–400)
Iron Saturation: 31 % (ref 15–55)
Iron: 76 ug/dL (ref 38–169)
Total Iron Binding Capacity: 243 ug/dL — ABNORMAL LOW (ref 250–450)
UIBC: 167 ug/dL (ref 111–343)

## 2024-10-25 LAB — COMPREHENSIVE METABOLIC PANEL WITH GFR
ALT: 40 IU/L (ref 0–44)
AST: 38 IU/L (ref 0–40)
Albumin: 4.4 g/dL (ref 3.9–4.9)
Alkaline Phosphatase: 80 IU/L (ref 47–123)
BUN/Creatinine Ratio: 14 (ref 10–24)
BUN: 23 mg/dL (ref 8–27)
Bilirubin Total: 0.7 mg/dL (ref 0.0–1.2)
CO2: 20 mmol/L (ref 20–29)
Calcium: 8.9 mg/dL (ref 8.6–10.2)
Chloride: 109 mmol/L — ABNORMAL HIGH (ref 96–106)
Creatinine, Ser: 1.68 mg/dL — ABNORMAL HIGH (ref 0.76–1.27)
Globulin, Total: 2.6 g/dL (ref 1.5–4.5)
Glucose: 95 mg/dL (ref 70–99)
Potassium: 4.3 mmol/L (ref 3.5–5.2)
Sodium: 143 mmol/L (ref 134–144)
Total Protein: 7 g/dL (ref 6.0–8.5)
eGFR: 45 mL/min/1.73 — ABNORMAL LOW (ref >59)

## 2024-10-25 LAB — PSA: Prostate Specific Ag, Serum: 3.6 ng/mL (ref 0.0–4.0)

## 2024-10-25 LAB — T4, FREE: Free T4: 0.87 ng/dL (ref 0.82–1.77)

## 2024-10-25 LAB — LIPID PANEL
Chol/HDL Ratio: 2.5 ratio (ref 0.0–5.0)
Cholesterol, Total: 126 mg/dL (ref 100–199)
HDL: 50 mg/dL (ref >39)
LDL Chol Calc (NIH): 64 mg/dL (ref 0–99)
Triglycerides: 52 mg/dL (ref 0–149)
VLDL Cholesterol Cal: 12 mg/dL (ref 5–40)

## 2024-10-25 LAB — TSH: TSH: 1.83 u[IU]/mL (ref 0.450–4.500)

## 2024-11-08 ENCOUNTER — Telehealth: Payer: Self-pay

## 2024-11-08 ENCOUNTER — Ambulatory Visit: Payer: Self-pay | Admitting: Family Medicine

## 2024-11-08 NOTE — Telephone Encounter (Signed)
 Copied from CRM [id number]. Topic: Referral - Question >> Nov 08, 2024  8:20 AM Wyona P wrote: Reason for CRM: PT wants to know if Dr. Ziglar can still send the referral to the ortho.   10/24/24- spoke with primary about Cortizone shot at visit.   Left shoulder and knee.

## 2024-11-12 ENCOUNTER — Telehealth: Payer: Self-pay

## 2024-11-12 ENCOUNTER — Telehealth: Payer: Self-pay | Admitting: Internal Medicine

## 2024-11-12 NOTE — Addendum Note (Signed)
 Addended by: Refugio Mcconico K on: 11/12/2024 07:33 AM   Modules accepted: Orders

## 2024-11-12 NOTE — Telephone Encounter (Signed)
 Copied from CRM #[id number]. Topic: Clinical - Medical Advice >> Nov 12, 2024  9:16 AM Anairis L wrote: Reason for CRM: Patient would like to know if you would suggest he gets blood work done  on 11/14/2024 since he just got some one 10/24/2024

## 2024-11-12 NOTE — Telephone Encounter (Signed)
 Pt said he just had labs at PCP - said Dr B can see those results in the system - pt preferred to cancel this appt - canceled appt w/pt on phone - [facility] [place]

## 2024-11-14 ENCOUNTER — Other Ambulatory Visit

## 2024-11-20 ENCOUNTER — Ambulatory Visit: Admitting: Family Medicine

## 2024-11-20 ENCOUNTER — Telehealth: Payer: Self-pay | Admitting: Family Medicine

## 2024-11-20 NOTE — Telephone Encounter (Signed)
 Appt. cancelled for 11/20/24. Provider is out of the office and pt stated he will call back if he thinks he needs to see her

## 2024-11-21 ENCOUNTER — Ambulatory Visit (INDEPENDENT_AMBULATORY_CARE_PROVIDER_SITE_OTHER)

## 2024-11-21 DIAGNOSIS — G8929 Other chronic pain: Secondary | ICD-10-CM

## 2024-11-21 DIAGNOSIS — M25512 Pain in left shoulder: Secondary | ICD-10-CM

## 2024-11-21 DIAGNOSIS — M7582 Other shoulder lesions, left shoulder: Secondary | ICD-10-CM | POA: Diagnosis not present

## 2024-11-21 DIAGNOSIS — M7542 Impingement syndrome of left shoulder: Secondary | ICD-10-CM | POA: Diagnosis not present

## 2024-11-21 MED ORDER — TRIAMCINOLONE ACETONIDE 40 MG/ML IJ SUSP
40.0000 mg | INTRAMUSCULAR | Status: AC | PRN
  Administered 2024-11-21: 40 mg via INTRA_ARTICULAR

## 2024-11-21 NOTE — Patient Instructions (Addendum)
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
## 2024-11-28 ENCOUNTER — Ambulatory Visit: Admitting: Internal Medicine

## 2024-12-05 ENCOUNTER — Telehealth: Payer: Self-pay | Admitting: Family Medicine

## 2024-12-05 NOTE — Telephone Encounter (Signed)
 Copied from CRM #[id number]. Topic: Clinical - Medical Advice >> Dec 05, 2024 12:57 PM Anairis L wrote: Reason for CRM: Patient is calling in because wife faxed in BP reading on 11/20/2024 and no one ever reached out regarding his reading. He also has a question regarding A1C levels.   Thank you.

## 2024-12-06 ENCOUNTER — Telehealth: Payer: Self-pay | Admitting: Family Medicine

## 2024-12-06 NOTE — Telephone Encounter (Signed)
 His wife faxed in his BP readings in November but we did not get these.  Please fax again.  Ae an appointment and we will review.  Also needs an A1c.

## 2024-12-06 NOTE — Telephone Encounter (Signed)
 See below

## 2024-12-06 NOTE — Telephone Encounter (Signed)
 Dr Ziglar called patient early today and spoken to him about resending his BP readings again.

## 2024-12-10 ENCOUNTER — Telehealth: Payer: Self-pay | Admitting: Family Medicine

## 2024-12-10 NOTE — Telephone Encounter (Signed)
 Reviewed his BP readings with him.  Most are very good.  Please make an appointment and bring your BP cuff with you so we can check calibration.  He agrees.

## 2024-12-18 ENCOUNTER — Encounter: Payer: Self-pay | Admitting: Family Medicine

## 2024-12-18 ENCOUNTER — Ambulatory Visit: Admitting: Family Medicine

## 2024-12-18 VITALS — BP 133/85 | HR 90 | Resp 16 | Ht 70.0 in | Wt 170.0 lb

## 2024-12-18 DIAGNOSIS — N401 Enlarged prostate with lower urinary tract symptoms: Secondary | ICD-10-CM | POA: Diagnosis not present

## 2024-12-18 DIAGNOSIS — N1831 Chronic kidney disease, stage 3a: Secondary | ICD-10-CM | POA: Diagnosis not present

## 2024-12-18 DIAGNOSIS — R7303 Prediabetes: Secondary | ICD-10-CM

## 2024-12-18 DIAGNOSIS — E538 Deficiency of other specified B group vitamins: Secondary | ICD-10-CM

## 2024-12-18 MED ORDER — SILODOSIN 4 MG PO CAPS: 4.0000 mg | ORAL_CAPSULE | Freq: Every day | ORAL | 3 refills | Status: AC

## 2024-12-18 MED ORDER — CYANOCOBALAMIN 1000 MCG/ML IJ SOLN
1000.0000 ug | Freq: Once | INTRAMUSCULAR | Status: AC
  Administered 2024-12-18: 1000 ug via INTRAMUSCULAR

## 2024-12-18 NOTE — Assessment & Plan Note (Signed)
 Did not tolerate Tamsulosin  because of sexual side effects.  Will trial Rapaflo  (Silodosin ).

## 2024-12-18 NOTE — Progress Notes (Signed)
 "  Established Patient Office Visit  Subjective   Patient ID: Marc Sutton, male    DOB: 1961/11/20  Age: 63 y.o. MRN: [id number]  Chief Complaint  Patient presents with   Hypertension    Hypertension   Delightful 63 year old gentleman with MGUS, kidney disease, B12 and iron deficiencies, mixed hyperlipidemia.    He has been checking his blood pressures at home and he brought his blood pressure cuff with him today.  His cuff reads about 8 points higher than what we got.  His blood pressure runs in the 140s at home so that is actually the 130s which is reasonable.  He is working on his diet.  Every Sunday he cooks his big ham breakfast.  He is going to give that up.  He tried tamsulosin  and it helped with his voiding symptoms but it caused sexual dysfunction.  Stopped taking it because of this.  Comes in today asking for an A1c.  Insurance is going to make him sign a waiver so he may have to pay $35 for the A1c.      Objective:     BP 133/85   Pulse 90   Resp 16   Ht 5' 10 (1.778 m)   Wt 170 lb (77.1 kg)   BMI 24.39 kg/m    Physical Exam Vitals and nursing note reviewed.  Constitutional:      Appearance: Normal appearance.  HENT:     Head: Normocephalic and atraumatic.  Eyes:     Conjunctiva/sclera: Conjunctivae normal.  Cardiovascular:     Rate and Rhythm: Normal rate and regular rhythm.  Pulmonary:     Effort: Pulmonary effort is normal.     Breath sounds: Normal breath sounds.  Musculoskeletal:     Right lower leg: No edema.     Left lower leg: No edema.  Skin:    General: Skin is warm and dry.  Neurological:     Mental Status: He is alert and oriented to person, place, and time.  Psychiatric:        Mood and Affect: Mood normal.        Behavior: Behavior normal.        Thought Content: Thought content normal.        Judgment: Judgment normal.          No results found for any visits on 12/18/24.    The ASCVD Risk score (Arnett DK, et al.,  2019) failed to calculate for the following reasons:   The valid total cholesterol range is 130 to 320 mg/dL    Assessment & Plan:  Prediabetes Assessment & Plan: His A1c was 6.0% about a year ago.  Checking A1c today.  He may have to pay $35 for this test.  Had him sign a waiver.    Orders: -     Hemoglobin A1c  Vitamin B12 deficiency Assessment & Plan: Checked his B12 level and then gave him his B12 injection.    Orders: -     Cyanocobalamin  -     Vitamin B12  Chronic kidney disease, stage 3a (HCC) -     CMP14+EGFR  Benign localized prostatic hyperplasia with lower urinary tract symptoms (LUTS) Assessment & Plan: Did not tolerate Tamsulosin  because of sexual side effects.  Will trial Rapaflo  (Silodosin ).  Orders: -     Silodosin ; Take 1 capsule (4 mg total) by mouth daily with breakfast.  Dispense: 30 capsule; Refill: 3  Stage 3a chronic kidney disease (HCC) Assessment &  Plan: Checking his renal function again.  Mild increase in his creatinine.        Return in about 3 months (around 03/18/2025).    Sonya Gunnoe K Dagon Budai, MD  "

## 2024-12-18 NOTE — Assessment & Plan Note (Signed)
 His A1c was 6.0% about a year ago.  Checking A1c today.  He may have to pay $35 for this test.  Had him sign a waiver.

## 2024-12-18 NOTE — Assessment & Plan Note (Signed)
 Checked his B12 level and then gave him his B12 injection.

## 2024-12-18 NOTE — Assessment & Plan Note (Signed)
 Checking his renal function again.  Mild increase in his creatinine.

## 2024-12-19 LAB — CMP14+EGFR
ALT: 36 IU/L (ref 0–44)
AST: 29 IU/L (ref 0–40)
Albumin: 4.3 g/dL (ref 3.9–4.9)
Alkaline Phosphatase: 81 IU/L (ref 47–123)
BUN/Creatinine Ratio: 13 (ref 10–24)
BUN: 21 mg/dL (ref 8–27)
Bilirubin Total: 0.6 mg/dL (ref 0.0–1.2)
CO2: 19 mmol/L — ABNORMAL LOW (ref 20–29)
Calcium: 9.2 mg/dL (ref 8.6–10.2)
Chloride: 107 mmol/L — ABNORMAL HIGH (ref 96–106)
Creatinine, Ser: 1.61 mg/dL — ABNORMAL HIGH (ref 0.76–1.27)
Globulin, Total: 2.6 g/dL (ref 1.5–4.5)
Glucose: 91 mg/dL (ref 70–99)
Potassium: 4.8 mmol/L (ref 3.5–5.2)
Sodium: 142 mmol/L (ref 134–144)
Total Protein: 6.9 g/dL (ref 6.0–8.5)
eGFR: 47 mL/min/1.73 — ABNORMAL LOW

## 2024-12-19 LAB — HEMOGLOBIN A1C
Est. average glucose Bld gHb Est-mCnc: 134 mg/dL
Hgb A1c MFr Bld: 6.3 % — ABNORMAL HIGH (ref 4.8–5.6)

## 2024-12-19 LAB — VITAMIN B12: Vitamin B-12: 500 pg/mL (ref 232–1245)

## 2024-12-25 ENCOUNTER — Ambulatory Visit: Payer: Self-pay | Admitting: Family Medicine

## 2025-01-03 ENCOUNTER — Telehealth: Payer: Self-pay

## 2025-01-03 MED ORDER — PRAZOSIN HCL 1 MG PO CAPS: 1.0000 mg | ORAL_CAPSULE | Freq: Every day | ORAL | 3 refills | Status: AC

## 2025-01-03 NOTE — Telephone Encounter (Unsigned)
 Copied from CRM [id number]. Topic: Clinical - Prescription Issue >> Jan 03, 2025 10:12 AM Porter L wrote: Reason for CRM: patient called and stated he is was unable to get his silodosin  (RAPAFLO ) 4 MG CAPS capsule said he got a letter in mail from [employer] that they will not cover it, patient asking if pcp has a different med she wants patient to try.   Callback at [phone]

## 2025-01-03 NOTE — Addendum Note (Signed)
 Addended by: Stokely Jeancharles K on: 01/03/2025 03:49 PM   Modules accepted: Orders

## 2025-01-24 NOTE — Telephone Encounter (Signed)
 This is an error.

## 2025-03-18 ENCOUNTER — Ambulatory Visit: Admitting: Family Medicine
